# Patient Record
Sex: Male | Born: 1960 | Race: White | Hispanic: No | Marital: Married | Smoking: Former smoker
Health system: Southern US, Community
[De-identification: ages and names within clinical notes are randomized; demographics above are authoritative.]

## PROBLEM LIST (undated history)

## (undated) DIAGNOSIS — I272 Pulmonary hypertension, unspecified: Secondary | ICD-10-CM

## (undated) DIAGNOSIS — E039 Hypothyroidism, unspecified: Secondary | ICD-10-CM

## (undated) DIAGNOSIS — K219 Gastro-esophageal reflux disease without esophagitis: Secondary | ICD-10-CM

## (undated) DIAGNOSIS — J309 Allergic rhinitis, unspecified: Secondary | ICD-10-CM

## (undated) DIAGNOSIS — J45909 Unspecified asthma, uncomplicated: Secondary | ICD-10-CM

## (undated) DIAGNOSIS — J849 Interstitial pulmonary disease, unspecified: Secondary | ICD-10-CM

## (undated) DIAGNOSIS — E669 Obesity, unspecified: Secondary | ICD-10-CM

## (undated) DIAGNOSIS — R0600 Dyspnea, unspecified: Secondary | ICD-10-CM

## (undated) DIAGNOSIS — T8859XA Other complications of anesthesia, initial encounter: Secondary | ICD-10-CM

## (undated) DIAGNOSIS — M48061 Spinal stenosis, lumbar region without neurogenic claudication: Secondary | ICD-10-CM

## (undated) DIAGNOSIS — J449 Chronic obstructive pulmonary disease, unspecified: Secondary | ICD-10-CM

## (undated) DIAGNOSIS — G4730 Sleep apnea, unspecified: Secondary | ICD-10-CM

## (undated) DIAGNOSIS — F32A Depression, unspecified: Secondary | ICD-10-CM

## (undated) DIAGNOSIS — M47817 Spondylosis without myelopathy or radiculopathy, lumbosacral region: Secondary | ICD-10-CM

## (undated) DIAGNOSIS — F329 Major depressive disorder, single episode, unspecified: Secondary | ICD-10-CM

## (undated) DIAGNOSIS — I4891 Unspecified atrial fibrillation: Secondary | ICD-10-CM

## (undated) DIAGNOSIS — I10 Essential (primary) hypertension: Secondary | ICD-10-CM

## (undated) DIAGNOSIS — F419 Anxiety disorder, unspecified: Secondary | ICD-10-CM

## (undated) DIAGNOSIS — J189 Pneumonia, unspecified organism: Secondary | ICD-10-CM

## (undated) DIAGNOSIS — D869 Sarcoidosis, unspecified: Secondary | ICD-10-CM

## (undated) DIAGNOSIS — E781 Pure hyperglyceridemia: Secondary | ICD-10-CM

## (undated) HISTORY — PX: OTHER SURGICAL HISTORY: SHX169

## (undated) HISTORY — PX: CARDIAC ELECTROPHYSIOLOGY STUDY AND ABLATION: SHX1294

---
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
## 2008-02-25 HISTORY — PX: RHINOPLASTY: SUR1284

## 2008-02-25 HISTORY — PX: CARDIAC CATHETERIZATION: SHX172

## 2010-02-24 HISTORY — PX: KNEE ARTHROSCOPY: SHX127

## 2010-08-17 ENCOUNTER — Ambulatory Visit: Payer: Self-pay | Admitting: Internal Medicine

## 2010-10-18 ENCOUNTER — Ambulatory Visit: Payer: Self-pay | Admitting: Cardiology

## 2010-11-12 ENCOUNTER — Ambulatory Visit: Payer: Self-pay | Admitting: Gastroenterology

## 2011-02-20 ENCOUNTER — Ambulatory Visit: Payer: Self-pay | Admitting: Surgery

## 2011-02-26 ENCOUNTER — Ambulatory Visit: Payer: Self-pay | Admitting: Surgery

## 2011-02-27 ENCOUNTER — Inpatient Hospital Stay: Payer: Self-pay | Admitting: Surgery

## 2011-03-05 ENCOUNTER — Emergency Department: Payer: Self-pay | Admitting: Surgery

## 2012-02-25 DIAGNOSIS — D649 Anemia, unspecified: Secondary | ICD-10-CM

## 2012-02-25 HISTORY — PX: KNEE ARTHROSCOPY: SHX127

## 2012-02-25 HISTORY — PX: CARDIAC ELECTROPHYSIOLOGY STUDY AND ABLATION: SHX1294

## 2012-02-25 HISTORY — DX: Anemia, unspecified: D64.9

## 2012-02-25 HISTORY — PX: CARDIAC CATHETERIZATION: SHX172

## 2013-02-24 HISTORY — PX: EXCISIONAL HEMORRHOIDECTOMY: SHX1541

## 2013-04-20 ENCOUNTER — Ambulatory Visit: Payer: Self-pay | Admitting: Family Medicine

## 2013-04-20 ENCOUNTER — Emergency Department: Payer: Self-pay | Admitting: Emergency Medicine

## 2013-04-20 LAB — TROPONIN I: Troponin-I: <0.02 ng/mL

## 2013-04-20 LAB — CBC
HCT: 46.2 % (ref 40.0–52.0)
HGB: 15.1 g/dL (ref 13.0–18.0)
MCH: 29.6 pg (ref 26.0–34.0)
MCHC: 32.6 g/dL (ref 32.0–36.0)
MCV: 91 fL (ref 80–100)
Platelet: 211 10*3/uL (ref 150–440)
RBC: 5.08 10*6/uL (ref 4.40–5.90)
RDW: 13.4 % (ref 11.5–14.5)
WBC: 10.6 10*3/uL (ref 3.8–10.6)

## 2013-04-20 LAB — BASIC METABOLIC PANEL
Anion Gap: 5 — ABNORMAL LOW (ref 7–16)
BUN: 12 mg/dL (ref 7–18)
CO2: 29 mmol/L (ref 21–32)
CREATININE: 1.1 mg/dL (ref 0.60–1.30)
Calcium, Total: 9.3 mg/dL (ref 8.5–10.1)
Chloride: 106 mmol/L (ref 98–107)
EGFR (African American): >60
EGFR (Non-African Amer.): >60
GLUCOSE: 88 mg/dL (ref 65–99)
OSMOLALITY: 279 (ref 275–301)
Potassium: 4 mmol/L (ref 3.5–5.1)
SODIUM: 140 mmol/L (ref 136–145)

## 2013-12-21 DIAGNOSIS — I272 Pulmonary hypertension, unspecified: Secondary | ICD-10-CM | POA: Insufficient documentation

## 2014-01-26 ENCOUNTER — Ambulatory Visit: Payer: Self-pay | Admitting: Cardiology

## 2014-02-24 HISTORY — PX: OTHER SURGICAL HISTORY: SHX169

## 2014-05-11 DIAGNOSIS — E781 Pure hyperglyceridemia: Secondary | ICD-10-CM | POA: Insufficient documentation

## 2014-06-18 NOTE — Consult Note (Signed)
PATIENT NAME:  Robert Green, FOOT MR#:  [id number] DATE OF BIRTH:  April 18, 1960  DATE OF CONSULTATION:  03/05/2011  REFERRING PHYSICIAN:   CONSULTING PHYSICIAN:  Loreli Dollar, MD  HISTORY OF PRESENT ILLNESS: This 54 year old male who had recent hemorrhoid surgery a week ago called with the chief complaint of a temperature of 102. He had had the recent internal and external hemorrhoidectomy under general anesthesia and the day after surgery had a temperature of 102 and we had a suspicion of infection and put him on intravenous Invanz, later sent him home on intravenous Augmentin 875 mg b.i.d. He has also been taking Percocet and Toradol and has had some bleeding from his operative site but was somewhat improved today, passing gas, has been moving his bowels his discharge.   He does, however, report that he also has a sore throat, it hurts to swallow and has noted some soreness in his neck as well.   PAST MEDICAL HISTORY: 1. Atrial fibrillation status post ablation 2008.  2. Sleep apnea and uses CPAP machine. 3. History of hypothyroidism. 4. History of pulmonary hypertension. 5. Reactive airways disease.   MEDICATIONS:  1. Levothyroxine.  2. Celexa.  3. Singulair.  4. Advair.  5. Recent Percocet. 6. Toradol. 7. Augmentin 875 mg b.i.d.   PHYSICAL EXAMINATION:  GENERAL: He is awake, alert, oriented.   VITAL SIGNS: Temperatures 100.2, pulse 89, respirations 22, blood pressure 124/72, pulse oximetry 98%.   SKIN: Warm, dry.   HEENT: Pharynx appears to have some purulent discharge and also has several blisters identified including largest appeared to be about 5 mm on the left side of the soft palate and his neck was with some mild soreness although no distinct adenopathy.   LUNGS: Sounds were clear. No respiratory distress.  RECTAL: The anal area was examined. I could identify his wounds, which are not yet healed, some scant mucus-type discharge. I do not see any significant erythema and  there is no palpable mass at this site.   LABORATORY, DIAGNOSTIC AND RADIOLOGICAL DATA: We did a quick strep test that was negative, also a throat culture.   IMPRESSION: Pharyngitis which at present appears is most likely viral.   PLAN: Continue with Augmentin 875 mg b.i.d. and take the Tylenol and/or Percocet if needed. He has a prescription for nystatin to swish and swallow. He will go ahead and return home and he will plan to follow with me in the office in one week.   ____________________________ J. Rochel Brome, MD jws:cms D: 03/05/2011 19:27:42 ET T: 03/06/2011 05:58:40 ET JOB#: [id number]  cc: Loreli Dollar, MD, <Dictator>  Loreli Dollar MD ELECTRONICALLY SIGNED 03/30/2011 15:04

## 2014-06-18 NOTE — Discharge Summary (Signed)
PATIENT NAME:  Robert Green, Robert Green MR#:  [id number] DATE OF BIRTH:  [birth year]  DATE OF ADMISSION:  02/27/2011 DATE OF DISCHARGE:  03/01/2011  ADMITTING DIAGNOSIS: Postoperative pain.   DISCHARGE DIAGNOSES:  1. Postoperative pain. 2. Postoperative infection.   HISTORY OF PRESENT ILLNESS: This is a 54 year old male who underwent internal and external hemorrhoidectomy for multiple large hemorrhoids on 01/02. He was discharged home the afternoon after surgery and pain was controlled. However, that evening he called the on-call physician with increasing uncontrolled pain. It was recommended that he come to the hospital for admission. He was admitted later that evening and started on a morphine PCA. He was using this very frequently for the first 12 hours. He was able to gradually decrease IV pain medication. He used Toradol a couple of times and then was able to transition to [employer] only. On the evening of postoperative day #1, he had a temperature up to 102. He was started on IV Invanz. After that the temperature did not go above 100. He was kept on Invanz while he was here. He was tolerating a diet, passing gas. Moderate amount of bloody drainage on the dressing. On the day of discharge, he was examined by the covering physician and deemed appropriate for discharge.   DISPOSITION: Discharged home with self-care in good condition.   MEDICATIONS:  1. Levothyroxine 125 mcg daily.  2. Zyrtec as needed.  3. Citalopram 40 mg 1/2 tab in the evening. 4. Ibuprofen 800 mg as needed.  5. Colace 50 mg daily.  6. Percocet 5/325mg   every 4 to 6 hours as needed.  7. Toradol 10 mg every six hours as needed.  8.   Amoxicillin 875mg  BID  DISCHARGE INSTRUCTIONS:  1. Change dressing as needed.  2. May shower.  3. Regular diet.  4. No exertional activity or heavy lifting.  5. Follow-up in two weeks for recheck or sooner with any concerns.    ____________________________ Celene Squibb. [place], [place] amc:ap D: 03/03/2011  11:23:06 ET T: 03/04/2011 13:04:00 ET JOB#: [id number]  cc: Celene Squibb. Theda Sers, [place], <Dictator> ANN M COLLINS PA ELECTRONICALLY SIGNED 03/04/2011 15:03

## 2014-06-18 NOTE — Op Note (Signed)
PATIENT NAME:  Robert Green, Robert Green MR#:  [id number] DATE OF BIRTH:  08-07-1960  DATE OF PROCEDURE:  02/26/2011  PREOPERATIVE DIAGNOSIS: Internal and external hemorrhoids.   POSTOPERATIVE DIAGNOSIS: Internal and external hemorrhoids.   PROCEDURE: Internal and external hemorrhoidectomy.   SURGEON: Loreli Dollar, MD  ANESTHESIA: General.   INDICATIONS: This 54 year old male has a 15 year history of intermittent hemorrhoidal symptoms including bleeding, also had large internal and external hemorrhoids demonstrated on physical exam and surgery was recommended for definitive treatment.   DESCRIPTION OF PROCEDURE: The patient was placed on the operating table in the supine position under general anesthesia. Legs were elevated into the lithotomy position using ankle straps, also pads were put between his lower legs and the upright supports. The anal area was prepared with Betadine solution and draped in a sterile manner.   Initial inspection revealed three large external hemorrhoids and also a smaller external hemorrhoid. Digital exam demonstrated no palpable mass. The anoderm was infiltrated with 0.5% Sensorcaine with epinephrine. The anal canal was dilated large enough to admit four fingers. The bivalve anal retractor was introduced. Multiple large internal hemorrhoids were identified.   The hemorrhoid at the 4:00 position was removed first with the bivalve retractor in place. A high ligation of the internal component was done with a 2-0 chromic suture ligature. Next, a V-shaped incision was made externally to score the skin. Next, the incision was continued with electrocautery and then used the Harmonic scalpel to dissect out the external hemorrhoid. The internal anal sphincter was identified and the dissection was carried with the Harmonic scalpel up to the internal component up to the previously placed suture ligature. Next, the internal component was further ligated with the same 2-0 chromic ligature.  The hemorrhoid was amputated. Several small bleeding points were cauterized. Hemostasis was subsequently intact. Next, the wound was closed with a running locked 2-0 chromic stitch carrying this out onto the external component closing the skin but leaving a small opening for drainage distally.   Next, this identical procedure was carried out at the 8:00 position with a similar suture ligature, external incision and similar dissection preserving the internal sphincter and also ligated and closed in a similar manner. Next, the identical procedure was carried out at the 10:00 position with a similar dissection, excision, and closure. Next, an external hemorrhoid at the 1:00 position was excised with an elliptical excision and dissected out the external hemorrhoid with electrocautery and put in a single 2-0 chromic to close the wound. Next, there was another hemorrhoid clot which was identified at approximately the 7:00 position which was opened and excised and left open for drainage.   Hemostasis subsequently appeared to be intact. The dressings were applied with paper tape. The patient tolerated surgery satisfactorily and was then prepared for transfer to the recovery room.  ____________________________ Lenna Sciara. Rochel Brome, MD jws:cms D: 02/26/2011 10:36:21 ET T: 02/26/2011 12:45:04 ET JOB#: [id number]  cc: Loreli Dollar, MD, <Dictator> Loreli Dollar MD ELECTRONICALLY SIGNED 03/26/2011 18:13

## 2014-08-17 DIAGNOSIS — R931 Abnormal findings on diagnostic imaging of heart and coronary circulation: Secondary | ICD-10-CM | POA: Insufficient documentation

## 2015-02-25 HISTORY — PX: SHOULDER ARTHROSCOPY: SHX128

## 2016-02-08 ENCOUNTER — Encounter: Payer: Self-pay | Admitting: *Deleted

## 2016-02-11 ENCOUNTER — Ambulatory Visit: Payer: BC Managed Care – PPO | Admitting: Anesthesiology

## 2016-02-11 ENCOUNTER — Encounter: Payer: Self-pay | Admitting: *Deleted

## 2016-02-11 ENCOUNTER — Ambulatory Visit
Admission: RE | Admit: 2016-02-11 | Discharge: 2016-02-11 | Disposition: A | Discharge status: Home / Self Care | Payer: BC Managed Care – PPO | Source: Ambulatory Visit | Attending: Gastroenterology | Admitting: Gastroenterology

## 2016-02-11 ENCOUNTER — Encounter
Admission: RE | Disposition: A | Discharge status: Home / Self Care | Payer: Self-pay | Source: Ambulatory Visit | Attending: Gastroenterology

## 2016-02-11 DIAGNOSIS — K640 First degree hemorrhoids: Secondary | ICD-10-CM | POA: Diagnosis not present

## 2016-02-11 DIAGNOSIS — D125 Benign neoplasm of sigmoid colon: Secondary | ICD-10-CM | POA: Diagnosis not present

## 2016-02-11 DIAGNOSIS — Z6841 Body Mass Index (BMI) 40.0 and over, adult: Secondary | ICD-10-CM | POA: Insufficient documentation

## 2016-02-11 DIAGNOSIS — Z86010 Personal history of colonic polyps: Secondary | ICD-10-CM | POA: Insufficient documentation

## 2016-02-11 DIAGNOSIS — E039 Hypothyroidism, unspecified: Secondary | ICD-10-CM | POA: Insufficient documentation

## 2016-02-11 DIAGNOSIS — J45909 Unspecified asthma, uncomplicated: Secondary | ICD-10-CM | POA: Insufficient documentation

## 2016-02-11 DIAGNOSIS — K5730 Diverticulosis of large intestine without perforation or abscess without bleeding: Secondary | ICD-10-CM | POA: Insufficient documentation

## 2016-02-11 DIAGNOSIS — Z79899 Other long term (current) drug therapy: Secondary | ICD-10-CM | POA: Insufficient documentation

## 2016-02-11 DIAGNOSIS — F419 Anxiety disorder, unspecified: Secondary | ICD-10-CM | POA: Diagnosis not present

## 2016-02-11 DIAGNOSIS — I4891 Unspecified atrial fibrillation: Secondary | ICD-10-CM | POA: Diagnosis not present

## 2016-02-11 DIAGNOSIS — E781 Pure hyperglyceridemia: Secondary | ICD-10-CM | POA: Insufficient documentation

## 2016-02-11 DIAGNOSIS — F329 Major depressive disorder, single episode, unspecified: Secondary | ICD-10-CM | POA: Insufficient documentation

## 2016-02-11 DIAGNOSIS — G4730 Sleep apnea, unspecified: Secondary | ICD-10-CM | POA: Insufficient documentation

## 2016-02-11 DIAGNOSIS — R159 Full incontinence of feces: Secondary | ICD-10-CM | POA: Insufficient documentation

## 2016-02-11 HISTORY — DX: Unspecified atrial fibrillation: I48.91

## 2016-02-11 HISTORY — DX: Sleep apnea, unspecified: G47.30

## 2016-02-11 HISTORY — DX: Hypothyroidism, unspecified: E03.9

## 2016-02-11 HISTORY — DX: Obesity, unspecified: E66.9

## 2016-02-11 HISTORY — DX: Pure hyperglyceridemia: E78.1

## 2016-02-11 HISTORY — DX: Unspecified asthma, uncomplicated: J45.909

## 2016-02-11 HISTORY — DX: Depression, unspecified: F32.A

## 2016-02-11 HISTORY — DX: Anxiety disorder, unspecified: F41.9

## 2016-02-11 HISTORY — DX: Major depressive disorder, single episode, unspecified: F32.9

## 2016-02-11 HISTORY — DX: Pulmonary hypertension, unspecified: I27.20

## 2016-02-11 HISTORY — DX: Allergic rhinitis, unspecified: J30.9

## 2016-02-11 HISTORY — PX: COLONOSCOPY WITH PROPOFOL: SHX5780

## 2016-02-11 SURGERY — COLONOSCOPY WITH PROPOFOL
Anesthesia: General

## 2016-02-11 MED ORDER — FENTANYL CITRATE (PF) 100 MCG/2ML IJ SOLN
INTRAMUSCULAR | Status: DC | PRN
  Administered 2016-02-11: 50 ug via INTRAVENOUS

## 2016-02-11 MED ORDER — SODIUM CHLORIDE 0.9 % IV SOLN
INTRAVENOUS | Status: DC
  Administered 2016-02-11 (×2): via INTRAVENOUS

## 2016-02-11 MED ORDER — MIDAZOLAM HCL 2 MG/2ML IJ SOLN
INTRAMUSCULAR | Status: DC | PRN
  Administered 2016-02-11: 2 mg via INTRAVENOUS

## 2016-02-11 MED ORDER — PROPOFOL 10 MG/ML IV BOLUS
INTRAVENOUS | Status: DC | PRN
Start: 2016-02-11 — End: 2016-02-11
  Administered 2016-02-11: 100 mg via INTRAVENOUS

## 2016-02-11 MED ORDER — PROPOFOL 500 MG/50ML IV EMUL
INTRAVENOUS | Status: DC | PRN
  Administered 2016-02-11: 180 ug/kg/min via INTRAVENOUS

## 2016-02-11 NOTE — Transfer of Care (Signed)
Immediate Anesthesia Transfer of Care Note  Patient: Robert Green  Procedure(s) Performed: Procedure(s): COLONOSCOPY WITH PROPOFOL (N/A)  Patient Location: PACU  Anesthesia Type:General  Level of Consciousness: awake  Airway & Oxygen Therapy: Patient Spontanous Breathing and Patient connected to nasal cannula oxygen  Post-op Assessment: Report given to RN and Post -op Vital signs reviewed and stable  Post vital signs: Reviewed and stable  Last Vitals:  Vitals:   02/11/16 1250  BP: 134/88  Pulse: 88  Resp: 20  Temp: 36.2 C    Last Pain:  Vitals:   02/11/16 1250  TempSrc: Tympanic         Complications: No apparent anesthesia complications

## 2016-02-11 NOTE — Anesthesia Postprocedure Evaluation (Signed)
Anesthesia Post Note  Patient: Robert Green  Procedure(s) Performed: Procedure(s) (LRB): COLONOSCOPY WITH PROPOFOL (N/A)  Patient location during evaluation: Endoscopy Anesthesia Type: General Level of consciousness: awake and alert and oriented Pain management: pain level controlled Vital Signs Assessment: post-procedure vital signs reviewed and stable Respiratory status: spontaneous breathing, nonlabored ventilation and respiratory function stable Cardiovascular status: blood pressure returned to baseline and stable Postop Assessment: no signs of nausea or vomiting Anesthetic complications: no     Last Vitals:  Vitals:   02/11/16 1541 02/11/16 1551  BP: 111/80 114/71  Pulse: 76 84  Resp: 18 17  Temp:      Last Pain:  Vitals:   02/11/16 1521  TempSrc: Tympanic                 Etheline Geppert

## 2016-02-11 NOTE — Op Note (Signed)
[facility] Gastroenterology Patient Name: Jabulani Flexer Procedure Date: 02/11/2016 2:14 PM MRN: [id number] Account #: [id number] Date of Birth: 1960-09-12 Admit Type: Outpatient Age: 55 Room: [facility] ENDO ROOM 1 Gender: Male Note Status: Finalized Procedure:            Colonoscopy Indications:          Personal history of colonic polyps Providers:            Lollie Sails, MD Referring MD:         Juluis Rainier (Referring MD) Medicines:            Monitored Anesthesia Care Complications:        No immediate complications. Procedure:            Pre-Anesthesia Assessment:                       - ASA Grade Assessment: III - A patient with severe                        systemic disease.                       After obtaining informed consent, the colonoscope was                        passed under direct vision. Throughout the procedure,                        the patient's blood pressure, pulse, and oxygen                        saturations were monitored continuously. The Olympus                        PCF-H180AL colonoscope ( S#: [id number] ) was introduced                        through the anus and advanced to the the cecum,                        identified by appendiceal orifice and ileocecal valve.                        The colonoscopy was performed without difficulty. The                        patient tolerated the procedure well. The quality of                        the bowel preparation was fair. Findings:      A few small-mouthed diverticula were found in the sigmoid colon and       descending colon.      A 3 mm polyp was found in the distal sigmoid colon. The polyp was flat.       The polyp was removed with a cold biopsy forceps. Resection and       retrieval were complete.      The entire examined colon appeared normal.      Non-bleeding internal hemorrhoids were found during anoscopy. The       hemorrhoids were small and Grade  I (internal  hemorrhoids that do not       prolapse). Impression:           - Preparation of the colon was fair.                       - Diverticulosis in the sigmoid colon and in the                        descending colon.                       - One 3 mm polyp in the distal sigmoid colon, removed                        with a cold biopsy forceps. Resected and retrieved.                       - The entire examined colon is normal. Recommendation:       - Await pathology results.                       - Telephone GI clinic for pathology results in 1 week. Procedure Code(s):    --- Professional ---                       [phone], Colonoscopy, flexible; with biopsy, single or                        multiple Diagnosis Code(s):    --- Professional ---                       D12.5, Benign neoplasm of sigmoid colon                       Z86.010, Personal history of colonic polyps                       K57.30, Diverticulosis of large intestine without                        perforation or abscess without bleeding CPT copyright 2016 American [employer]. All rights reserved. The codes documented in this report are preliminary and upon coder review may  be revised to meet current compliance requirements. Lollie Sails, MD 02/11/2016 3:12:51 PM This report has been signed electronically. Number of Addenda: 0 Note Initiated On: 02/11/2016 2:14 PM Scope Withdrawal Time: 0 hours 15 minutes 40 seconds  Total Procedure Duration: 0 hours 33 minutes 4 seconds       [facility]

## 2016-02-11 NOTE — Anesthesia Preprocedure Evaluation (Signed)
Anesthesia Evaluation  Patient identified by MRN, date of birth, ID band  Reviewed: Allergy & Precautions, NPO status , Patient's Chart, lab work & pertinent test results  Airway Mallampati: II       Dental  (+) Teeth Intact   Pulmonary sleep apnea , COPD, former smoker,     + decreased breath sounds      Cardiovascular Exercise Tolerance: Good + dysrhythmias Atrial Fibrillation  Rhythm:Irregular     Neuro/Psych Anxiety Depression    GI/Hepatic negative GI ROS, Neg liver ROS,   Endo/Other  Hypothyroidism Morbid obesity  Renal/GU negative Renal ROS     Musculoskeletal   Abdominal (+) + obese,   Peds  Hematology negative hematology ROS (+)   Anesthesia Other Findings   Reproductive/Obstetrics                             Anesthesia Physical Anesthesia Plan  ASA: III  Anesthesia Plan: General   Post-op Pain Management:    Induction: Intravenous  Airway Management Planned: Natural Airway and Nasal Cannula  Additional Equipment:   Intra-op Plan:   Post-operative Plan:   Informed Consent: I have reviewed the patients History and Physical, chart, labs and discussed the procedure including the risks, benefits and alternatives for the proposed anesthesia with the patient or authorized representative who has indicated his/her understanding and acceptance.     Plan Discussed with: CRNA  Anesthesia Plan Comments:         Anesthesia Quick Evaluation

## 2016-02-11 NOTE — Anesthesia Procedure Notes (Signed)
Date/Time: 02/11/2016 2:50 PM Performed by: Allean Found Pre-anesthesia Checklist: Patient identified, Emergency Drugs available, Suction available, Patient being monitored and Timeout performed Oxygen Delivery Method: Nasal cannula Placement Confirmation: positive ETCO2

## 2016-02-11 NOTE — H&P (Signed)
Outpatient short stay form Pre-procedure 02/11/2016 2:22 PM Lollie Sails MD  Primary Physician: Mercy Riding, NP  Reason for visit:  Colonoscopy  History of present illness:  Patient is a 55 year old male presenting today as well. He has personal history of adenomatous colon polyps with his last colonoscopy being 11/12/2010. That time he had a hemorrhoidectomy multiple complications. His last procedure in that regard was about 2 years ago. He does have a small amount of fecal leakage however is doing well currently otherwise. He does have atrial fibrillation but does not take any blood thinning agents. He takes no aspirin product. He tolerated his prep well.    Current Facility-Administered Medications:  .  0.9 %  sodium chloride infusion, , Intravenous, Continuous, Lollie Sails, MD, Last Rate: 20 mL/hr at 02/11/16 1304  Prescriptions Prior to Admission  Medication Sig Dispense Refill Last Dose  . albuterol (PROVENTIL HFA;VENTOLIN HFA) 108 (90 Base) MCG/ACT inhaler Inhale 2 puffs into the lungs every 6 (six) hours as needed for wheezing or shortness of breath.   Past Week at Unknown time  . cetirizine (ZYRTEC) 10 MG tablet Take 10 mg by mouth daily.   Past Week at Unknown time  . diclofenac (VOLTAREN) 50 MG EC tablet Take 50 mg by mouth 2 (two) times daily.   Past Month at Unknown time  . Fluticasone-Salmeterol (ADVAIR) 100-50 MCG/DOSE AEPB Inhale 1 puff into the lungs 2 (two) times daily.   Past Month at Unknown time  . furosemide (LASIX) 20 MG tablet Take 20 mg by mouth.   Past Month at Unknown time  . levothyroxine (SYNTHROID, LEVOTHROID) 125 MCG tablet Take 125 mcg by mouth daily before breakfast.   02/11/2016 at 0600  . LORazepam (ATIVAN) 2 MG tablet Take 2 mg by mouth every 6 (six) hours as needed for anxiety.   Past Week at Unknown time  . montelukast (SINGULAIR) 10 MG tablet Take 10 mg by mouth at bedtime.   02/10/2016 at Unknown time  . pantoprazole (PROTONIX) 40 MG tablet  Take 40 mg by mouth daily.   Past Month at Unknown time  . pimecrolimus (ELIDEL) 1 % cream Apply topically 2 (two) times daily.   Past Month at Unknown time     Allergies  Allergen Reactions  . Adhesive [Tape] Dermatitis  . Latex Dermatitis  . Citalopram Palpitations     Past Medical History:  Diagnosis Date  . Anxiety   . Atrial fibrillation ([place])   . Depression   . Hypertriglyceridemia   . Hypothyroidism   . Obesity   . Pulmonary hypertension   . RAD (reactive airway disease)   . Rhinitis, allergic   . Sleep apnea     Review of systems:      Physical Exam    Heart and lungs: Regular rate and rhythm without rub or gallop, lungs are bilaterally clear.    HEENT: Normocephalic atraumatic eyes are anicteric    Other:    Pertinant exam for procedure: Soft nontender nondistended bowel sounds positive normoactive.    Planned proceedures: Colonoscopy and indicated procedures. I have discussed the risks benefits and complications of procedures to include not limited to bleeding, infection, perforation and the risk of sedation and the patient wishes to proceed. Outpatient short stay form Pre-procedure 02/11/2016 2:25 PM Lollie Sails MD

## 2016-02-12 ENCOUNTER — Encounter: Payer: Self-pay | Admitting: Gastroenterology

## 2016-02-13 LAB — SURGICAL PATHOLOGY

## 2016-02-25 HISTORY — PX: CARDIAC ELECTROPHYSIOLOGY STUDY AND ABLATION: SHX1294

## 2016-09-09 DIAGNOSIS — Z01818 Encounter for other preprocedural examination: Secondary | ICD-10-CM | POA: Insufficient documentation

## 2016-09-25 DIAGNOSIS — M7520 Bicipital tendinitis, unspecified shoulder: Secondary | ICD-10-CM | POA: Insufficient documentation

## 2017-04-20 ENCOUNTER — Other Ambulatory Visit: Payer: Self-pay | Admitting: Nurse Practitioner

## 2017-04-20 ENCOUNTER — Ambulatory Visit
Admission: RE | Admit: 2017-04-20 | Discharge: 2017-04-20 | Disposition: A | Discharge status: Home / Self Care | Payer: BC Managed Care – PPO | Source: Ambulatory Visit | Attending: Nurse Practitioner | Admitting: Nurse Practitioner

## 2017-04-20 DIAGNOSIS — R600 Localized edema: Secondary | ICD-10-CM | POA: Insufficient documentation

## 2017-04-20 DIAGNOSIS — M79661 Pain in right lower leg: Secondary | ICD-10-CM | POA: Insufficient documentation

## 2017-04-20 DIAGNOSIS — R0602 Shortness of breath: Secondary | ICD-10-CM

## 2017-04-21 ENCOUNTER — Other Ambulatory Visit: Payer: Self-pay | Admitting: Nurse Practitioner

## 2017-04-21 DIAGNOSIS — M79661 Pain in right lower leg: Secondary | ICD-10-CM

## 2017-04-21 DIAGNOSIS — R0602 Shortness of breath: Secondary | ICD-10-CM

## 2017-04-21 DIAGNOSIS — R600 Localized edema: Secondary | ICD-10-CM

## 2017-05-07 ENCOUNTER — Encounter (INDEPENDENT_AMBULATORY_CARE_PROVIDER_SITE_OTHER): Payer: Self-pay | Admitting: Vascular Surgery

## 2017-05-07 ENCOUNTER — Ambulatory Visit (INDEPENDENT_AMBULATORY_CARE_PROVIDER_SITE_OTHER): Payer: BC Managed Care – PPO | Admitting: Vascular Surgery

## 2017-05-07 DIAGNOSIS — J449 Chronic obstructive pulmonary disease, unspecified: Secondary | ICD-10-CM

## 2017-05-07 DIAGNOSIS — M79605 Pain in left leg: Secondary | ICD-10-CM

## 2017-05-07 DIAGNOSIS — I83813 Varicose veins of bilateral lower extremities with pain: Secondary | ICD-10-CM | POA: Diagnosis not present

## 2017-05-07 DIAGNOSIS — I872 Venous insufficiency (chronic) (peripheral): Secondary | ICD-10-CM | POA: Diagnosis not present

## 2017-05-07 DIAGNOSIS — K219 Gastro-esophageal reflux disease without esophagitis: Secondary | ICD-10-CM

## 2017-05-07 DIAGNOSIS — M79604 Pain in right leg: Secondary | ICD-10-CM | POA: Diagnosis not present

## 2017-05-07 DIAGNOSIS — G4762 Sleep related leg cramps: Secondary | ICD-10-CM

## 2017-05-08 ENCOUNTER — Encounter (INDEPENDENT_AMBULATORY_CARE_PROVIDER_SITE_OTHER): Payer: Self-pay | Admitting: Vascular Surgery

## 2017-05-08 DIAGNOSIS — K219 Gastro-esophageal reflux disease without esophagitis: Secondary | ICD-10-CM | POA: Insufficient documentation

## 2017-05-08 DIAGNOSIS — M79606 Pain in leg, unspecified: Secondary | ICD-10-CM | POA: Insufficient documentation

## 2017-05-08 DIAGNOSIS — G4762 Sleep related leg cramps: Secondary | ICD-10-CM | POA: Insufficient documentation

## 2017-05-08 DIAGNOSIS — J449 Chronic obstructive pulmonary disease, unspecified: Secondary | ICD-10-CM | POA: Insufficient documentation

## 2017-05-08 DIAGNOSIS — I872 Venous insufficiency (chronic) (peripheral): Secondary | ICD-10-CM | POA: Insufficient documentation

## 2017-05-08 DIAGNOSIS — I83813 Varicose veins of bilateral lower extremities with pain: Secondary | ICD-10-CM | POA: Insufficient documentation

## 2017-05-08 NOTE — Progress Notes (Signed)
MRN : [id number]  Robert Green is a 57 y.o. ([birth year]) male who presents with chief complaint of  Chief Complaint  Patient presents with  . New Patient (Initial Visit)    ref Gauger for bil le edema  .  History of Present Illness:  The patient is seen for evaluation of symptomatic varicose veins. The patient relates burning and stinging which worsened steadily throughout the course of the day, particularly with standing. The patient also notes an aching and throbbing pain over the varicosities, particularly with prolonged dependent positions. The symptoms are significantly improved with elevation.  The patient also notes that during hot weather the symptoms are greatly intensified. The patient states the pain from the varicose veins interferes with work, daily exercise, shopping and household maintenance. At this point, the symptoms are persistent and severe enough that they're having a negative impact on lifestyle and are interfering with daily activities.  There is no history of DVT, PE or superficial thrombophlebitis. There is no history of ulceration or hemorrhage. The patient denies a significant family history of varicose veins.  The patient has not worn graduated compression in the past. At the present time the patient has not been using over-the-counter analgesics. There is a history of prior ablation of both legs with  Sclerotherapy.  This was years ago.  The patient is also c/o of painful lower extremities. The pain occurs primarily at night while the patient is in bed.   The patient describes it as a cramping or Charley horse type pain. The patient notes the pain isn't associated with activity and is not very consistent day to day. The pain seems to be variable with time. Typically the pain occurs with varying positions and seems to progress until the leg is stretched. The pain has been progressive over the past several years which has prompted the concern for evaluation. The  patient states this inability to walk has a significant negative impact on her quality of life and daily activities.  The patient denies a history of degenerative spine disease.  The patient denies rest pain. The patient denies dangling off the affected extremity during the night for pain relief. There are no open wounds or sores at this time.  No prior arterial vascular interventions or vascular surgeries.  The patient denies amaurosis fugax or recent TIA symptoms. There are no recent neurological changes noted. The patient denies history of DVT, PE or superficial thrombophlebitis. The patient denies recent episodes of angina or shortness of breath.      Current Meds  Medication Sig  . albuterol (PROVENTIL HFA;VENTOLIN HFA) 108 (90 Base) MCG/ACT inhaler Inhale 2 puffs into the lungs every 6 (six) hours as needed for wheezing or shortness of breath.  . cetirizine (ZYRTEC) 10 MG tablet Take 10 mg by mouth daily.  . diclofenac (VOLTAREN) 50 MG EC tablet Take 50 mg by mouth daily.   . Fluticasone-Salmeterol (ADVAIR) 100-50 MCG/DOSE AEPB Inhale 1 puff into the lungs daily.   . furosemide (LASIX) 20 MG tablet Take 20 mg by mouth as needed.   . hydrocortisone (PROCTOSOL HC) 2.5 % rectal cream Proctosol HC 2.5 % topical cream perineal applicator  INSERT INTO THE RECTUM TWO (2) TIMES A DAY AS DIRECTED  . levothyroxine (SYNTHROID, LEVOTHROID) 125 MCG tablet Take 125 mcg by mouth daily before breakfast.  . LORazepam (ATIVAN) 2 MG tablet Take 2 mg by mouth every 6 (six) hours as needed for anxiety.  . montelukast (SINGULAIR) 10 MG tablet  Take 10 mg by mouth at bedtime.    Past Medical History:  Diagnosis Date  . Anxiety   . Atrial fibrillation ([place])   . Depression   . Hypertriglyceridemia   . Hypothyroidism   . Obesity   . Pulmonary hypertension ([place])   . RAD (reactive airway disease)   . Rhinitis, allergic   . Sleep apnea     Past Surgical History:  Procedure Laterality Date  .  arthroscopic knee Bilateral   . CARDIAC ELECTROPHYSIOLOGY STUDY AND ABLATION    . COLONOSCOPY WITH PROPOFOL N/A 02/11/2016   Procedure: COLONOSCOPY WITH PROPOFOL;  Surgeon: Lollie Sails, MD;  Location: [facility] ENDOSCOPY;  Service: Endoscopy;  Laterality: N/A;  . fistulla repair      Social History Social History   Tobacco Use  . Smoking status: Former Smoker    Last attempt to quit: 02/25/1988    Years since quitting: 29.2  . Smokeless tobacco: Former Systems developer    Types: Chew    Quit date: 02/10/2006  Substance Use Topics  . Alcohol use: Yes    Alcohol/week: 0.6 oz    Types: 1 Cans of beer per week  . Drug use: No    Family History Family History  Problem Relation Age of Onset  . Cancer Father   . Diabetes Sister   . Diabetes Brother   No family history of bleeding/clotting disorders, porphyria or autoimmune disease   Allergies  Allergen Reactions  . Adhesive [Tape] Dermatitis  . Latex Dermatitis  . Tetracyclines & Related   . Citalopram Palpitations  . Povidone Iodine Rash     REVIEW OF SYSTEMS (Negative unless checked)  Constitutional: [] Weight loss  [] Fever  [] Chills Cardiac: [] Chest pain   [] Chest pressure   [] Palpitations   [] Shortness of breath when laying flat   [] Shortness of breath with exertion. Vascular:  [] Pain in legs with walking   [x] Pain in legs at rest  [] History of DVT   [] Phlebitis   [x] Swelling in legs   [x] Varicose veins   [] Non-healing ulcers Pulmonary:   [] Uses home oxygen   [] Productive cough   [] Hemoptysis   [] Wheeze  [] COPD   [] Asthma Neurologic:  [] Dizziness   [] Seizures   [] History of stroke   [] History of TIA  [] Aphasia   [] Vissual changes   [] Weakness or numbness in arm   [] Weakness or numbness in leg Musculoskeletal:   [] Joint swelling   [] Joint pain   [] Low back pain Hematologic:  [] Easy bruising  [] Easy bleeding   [] Hypercoagulable state   [] Anemic Gastrointestinal:  [] Diarrhea   [] Vomiting  [] Gastroesophageal reflux/heartburn    [] Difficulty swallowing. Genitourinary:  [] Chronic kidney disease   [] Difficult urination  [] Frequent urination   [] Blood in urine Skin:  [] Rashes   [] Ulcers  Psychological:  [x] History of anxiety   []  History of major depression.  Physical Examination  Vitals:   05/07/17 1449  BP: 132/74  Pulse: 60  Resp: 17  Weight: (!) 375 lb (170.1 kg)  Height: 6\' 4"  (1.93 m)   Body mass index is 45.65 kg/m. Gen: WD/WN, NAD Head: [place]/AT, No temporalis wasting.  Ear/Nose/Throat: Hearing grossly intact, nares w/o erythema or drainage, poor dentition Eyes: PER, EOMI, sclera nonicteric.  Neck: Supple, no masses.  No bruit or JVD.  Pulmonary:  Good air movement, clear to auscultation bilaterally, no use of accessory muscles.  Cardiac: RRR, normal S1, S2, no Murmurs. Vascular: Large varicosities present extensively greater than 10 mm bilaterally.  Mild venous stasis changes to the  legs bilaterally.  2+ soft pitting edema Vessel Right Left  Radial Palpable Palpable  PT Palpable Palpable  DP Palpable Palpable  Gastrointestinal: soft, non-distended. No guarding/no peritoneal signs.  Musculoskeletal: M/S 5/5 throughout.  No deformity or atrophy.  Neurologic: CN 2-12 intact. Pain and light touch intact in extremities.  Symmetrical.  Speech is fluent. Motor exam as listed above. Psychiatric: Judgment intact, Mood & affect appropriate for pt's clinical situation. Dermatologic: moderate venous rashes no ulcers noted.  No changes consistent with cellulitis. Lymph : No Cervical lymphadenopathy, no lichenification or skin changes of chronic lymphedema.  CBC Lab Results  Component Value Date   WBC 10.6 04/20/2013   HGB 15.1 04/20/2013   HCT 46.2 04/20/2013   MCV 91 04/20/2013   PLT 211 04/20/2013    BMET    Component Value Date/Time   NA 140 04/20/2013 1855   K 4.0 04/20/2013 1855   CL 106 04/20/2013 1855   CO2 29 04/20/2013 1855   GLUCOSE 88 04/20/2013 1855   BUN 12 04/20/2013 1855    CREATININE 1.10 04/20/2013 1855   CALCIUM 9.3 04/20/2013 1855   GFRNONAA >60 04/20/2013 1855   GFRAA >60 04/20/2013 1855   CrCl cannot be calculated (Patient's most recent lab result is older than the maximum 21 days allowed.).  COAG No results found for: INR, PROTIME  Radiology US Venous Img Lower Unilateral Right  Result Date: 04/20/2017 CLINICAL DATA:  Right lower extremity pain and edema for the past 3 weeks. Evaluate for DVT. EXAM: RIGHT LOWER EXTREMITY VENOUS DOPPLER ULTRASOUND TECHNIQUE: Gray-scale sonography with graded compression, as well as color Doppler and duplex ultrasound were performed to evaluate the lower extremity deep venous systems from the level of the common femoral vein and including the common femoral, femoral, profunda femoral, popliteal and calf veins including the posterior tibial, peroneal and gastrocnemius veins when visible. The superficial great saphenous vein was also interrogated. Spectral Doppler was utilized to evaluate flow at rest and with distal augmentation maneuvers in the common femoral, femoral and popliteal veins. COMPARISON:  None. FINDINGS: Contralateral Common Femoral Vein: Respiratory phasicity is normal and symmetric with the symptomatic side. No evidence of thrombus. Normal compressibility. Common Femoral Vein: No evidence of thrombus. Normal compressibility, respiratory phasicity and response to augmentation. Saphenofemoral Junction: No evidence of thrombus. Normal compressibility and flow on color Doppler imaging. Profunda Femoral Vein: No evidence of thrombus. Normal compressibility and flow on color Doppler imaging. Femoral Vein: No evidence of thrombus. Normal compressibility, respiratory phasicity and response to augmentation. Popliteal Vein: No evidence of thrombus. Normal compressibility, respiratory phasicity and response to augmentation. Calf Veins: No evidence of thrombus. Normal compressibility and flow on color Doppler imaging. Superficial  Great Saphenous Vein: No evidence of thrombus. Normal compressibility. Venous Reflux:  None. Other Findings:  None. IMPRESSION: No evidence of DVT within the right lower extremity. Electronically Signed   By: Sandi Mariscal M.D.   On: 04/20/2017 16:56     Assessment/Plan 1. Varicose veins of both lower extremities with pain  Recommend:  The patient has large symptomatic varicose veins that are painful and associated with swelling.  I have had a long discussion with the patient regarding  varicose veins and why they cause symptoms.  Patient will begin wearing graduated compression stockings class 1 on a daily basis, beginning first thing in the morning and removing them in the evening. The patient is instructed specifically not to sleep in the stockings.    The patient  will also begin  using over-the-counter analgesics such as Motrin 600 mg po TID to help control the symptoms.    In addition, behavioral modification including elevation during the day will be initiated.    Pending the results of these changes the  patient will be reevaluated in three months.   An  ultrasound of the venous system will be obtained.   Further plans will be based on the ultrasound results and whether conservative therapies are successful at eliminating the pain and swelling.   - VAS [place] LOWER EXTREMITY VENOUS REFLUX; Future  2. Chronic venous insufficiency No surgery or intervention at this point in time.    I have had a long discussion with the patient regarding venous insufficiency and why it  causes symptoms. I have discussed with the patient the chronic skin changes that accompany venous insufficiency and the long term sequela such as infection and ulceration.  Patient will begin wearing graduated compression stockings class 1 (20-30 mmHg) or compression wraps on a daily basis a prescription was given. The patient will put the stockings on first thing in the morning and removing them in the evening. The patient is  instructed specifically not to sleep in the stockings.    In addition, behavioral modification including several periods of elevation of the lower extremities during the day will be continued. I have demonstrated that proper elevation is a position with the ankles at heart level.  The patient is instructed to begin routine exercise, especially walking on a daily basis  Patient should undergo duplex ultrasound of the venous system to ensure that DVT or reflux is not present.  3. Pain in both lower extremities See #1&2  4. Leg cramps, sleep related Recommend:  The patient is describing Charley horse type leg cramps. No invasive studies, angiography or surgery at this time.    I have reviewed homeopathic remedies such as Cider vinegar or mustard; placing a bar of soap at the bottom of the bed. Quinine is also an option Magnesium supplementation at bedtime was also reviewed.  The patient should continue walking and begin a more formal exercise program.  The patient should continue antiplatelet therapy and aggressive treatment of the lipid abnormalities  The patient should continue wearing graduated compression socks 20-30 mmHg strength to control any mild edema.  The patient will follow up with me on a PRN basis.   5. Chronic obstructive pulmonary disease, unspecified COPD type ([place]) Continue pulmonary medications and aerosols as already ordered, these medications have been reviewed and there are no changes at this time.    6. Gastroesophageal reflux disease, esophagitis presence not specified Continue antihypertensive medications as already ordered, these medications have been reviewed and there are no changes at this time.  Avoidence of caffeine and alcohol  Moderate elevation of the head of the bed     Hortencia Pilar, MD  05/08/2017 12:43 PM

## 2017-06-04 ENCOUNTER — Ambulatory Visit (INDEPENDENT_AMBULATORY_CARE_PROVIDER_SITE_OTHER): Payer: BC Managed Care – PPO | Admitting: Vascular Surgery

## 2017-06-04 ENCOUNTER — Ambulatory Visit (INDEPENDENT_AMBULATORY_CARE_PROVIDER_SITE_OTHER): Payer: BC Managed Care – PPO

## 2017-06-04 ENCOUNTER — Encounter (INDEPENDENT_AMBULATORY_CARE_PROVIDER_SITE_OTHER): Payer: Self-pay | Admitting: Vascular Surgery

## 2017-06-04 VITALS — BP 139/77 | HR 62 | Resp 17 | Ht 76.0 in | Wt 376.8 lb

## 2017-06-04 DIAGNOSIS — I83813 Varicose veins of bilateral lower extremities with pain: Secondary | ICD-10-CM | POA: Diagnosis not present

## 2017-06-04 DIAGNOSIS — J449 Chronic obstructive pulmonary disease, unspecified: Secondary | ICD-10-CM | POA: Diagnosis not present

## 2017-06-04 DIAGNOSIS — K219 Gastro-esophageal reflux disease without esophagitis: Secondary | ICD-10-CM

## 2017-06-04 DIAGNOSIS — I872 Venous insufficiency (chronic) (peripheral): Secondary | ICD-10-CM

## 2017-06-07 ENCOUNTER — Encounter (INDEPENDENT_AMBULATORY_CARE_PROVIDER_SITE_OTHER): Payer: Self-pay | Admitting: Vascular Surgery

## 2017-06-07 NOTE — Progress Notes (Addendum)
MRN : [id number]  Robert Green is a 57 y.o. (1960/06/20) male who presents with chief complaint of  Chief Complaint  Patient presents with  . Follow-up  .  History of Present Illness: The patient returns for followup evaluation 3 months after the initial visit. The patient continues to have pain in the lower extremities with dependency. The pain is lessened with elevation. Graduated compression stockings, Class I (20-30 mmHg), have been worn but the stockings do not eliminate the leg pain. Over-the-counter analgesics do not improve the symptoms. The degree of discomfort continues to interfere with daily activities. The patient notes the pain in the legs is causing problems with daily exercise, at the workplace and even with household activities and maintenance such as standing in the kitchen preparing meals and doing dishes.   Venous ultrasound shows normal deep venous system, no evidence of acute or chronic DVT but deep reflux is present.  Superficial reflux is present in the GSV  Current Meds  Medication Sig  . albuterol (PROVENTIL HFA;VENTOLIN HFA) 108 (90 Base) MCG/ACT inhaler Inhale 2 puffs into the lungs every 6 (six) hours as needed for wheezing or shortness of breath.  . cetirizine (ZYRTEC) 10 MG tablet Take 10 mg by mouth daily.  . Fluticasone-Salmeterol (ADVAIR) 100-50 MCG/DOSE AEPB Inhale 1 puff into the lungs daily.   . furosemide (LASIX) 20 MG tablet Take 20 mg by mouth as needed.   . hydrocortisone (PROCTOSOL HC) 2.5 % rectal cream Proctosol HC 2.5 % topical cream perineal applicator  INSERT INTO THE RECTUM TWO (2) TIMES A DAY AS DIRECTED  . ibuprofen (ADVIL,MOTRIN) 200 MG tablet Take 200 mg by mouth every 6 (six) hours as needed.  Mar[place] Kitchen levothyroxine (SYNTHROID, LEVOTHROID) 125 MCG tablet Take 125 mcg by mouth daily before breakfast.  . LORazepam (ATIVAN) 2 MG tablet Take 2 mg by mouth every 6 (six) hours as needed for anxiety.  . montelukast (SINGULAIR) 10 MG tablet Take 10  mg by mouth at bedtime.  . pantoprazole (PROTONIX) 40 MG tablet Take 40 mg by mouth daily.  . pimecrolimus (ELIDEL) 1 % cream Apply topically 2 (two) times daily.    Past Medical History:  Diagnosis Date  . Anxiety   . Atrial fibrillation ([place])   . Depression   . Hypertriglyceridemia   . Hypothyroidism   . Obesity   . Pulmonary hypertension ([place])   . RAD (reactive airway disease)   . Rhinitis, allergic   . Sleep apnea     Past Surgical History:  Procedure Laterality Date  . arthroscopic knee Bilateral   . CARDIAC ELECTROPHYSIOLOGY STUDY AND ABLATION    . COLONOSCOPY WITH PROPOFOL N/A 02/11/2016   Procedure: COLONOSCOPY WITH PROPOFOL;  Surgeon: Lollie Sails, MD;  Location: [facility] ENDOSCOPY;  Service: Endoscopy;  Laterality: N/A;  . fistulla repair      Social History Social History   Tobacco Use  . Smoking status: Former Smoker    Last attempt to quit: 02/25/1988    Years since quitting: 29.3  . Smokeless tobacco: Former Systems developer    Types: Chew    Quit date: 02/10/2006  Substance Use Topics  . Alcohol use: Yes    Alcohol/week: 0.6 oz    Types: 1 Cans of beer per week  . Drug use: No    Family History Family History  Problem Relation Age of Onset  . Cancer Father   . Diabetes Sister   . Diabetes Brother     Allergies  Allergen Reactions  . Adhesive [Tape] Dermatitis  . Latex Dermatitis  . Tetracyclines & Related   . Citalopram Palpitations  . Povidone Iodine Rash     REVIEW OF SYSTEMS (Negative unless checked)  Constitutional: [] Weight loss  [] Fever  [] Chills Cardiac: [] Chest pain   [] Chest pressure   [] Palpitations   [] Shortness of breath when laying flat   [] Shortness of breath with exertion. Vascular:  [] Pain in legs with walking   [x] Pain in legs at rest  [] History of DVT   [] Phlebitis   [x] Swelling in legs   [x] Varicose veins   [] Non-healing ulcers Pulmonary:   [] Uses home oxygen   [] Productive cough   [] Hemoptysis   [] Wheeze  [] COPD    [] Asthma Neurologic:  [] Dizziness   [] Seizures   [] History of stroke   [] History of TIA  [] Aphasia   [] Vissual changes   [] Weakness or numbness in arm   [] Weakness or numbness in leg Musculoskeletal:   [] Joint swelling   [] Joint pain   [] Low back pain Hematologic:  [] Easy bruising  [] Easy bleeding   [] Hypercoagulable state   [] Anemic Gastrointestinal:  [] Diarrhea   [] Vomiting  [] Gastroesophageal reflux/heartburn   [] Difficulty swallowing. Genitourinary:  [] Chronic kidney disease   [] Difficult urination  [] Frequent urination   [] Blood in urine Skin:  [] Rashes   [] Ulcers  Psychological:  [] History of anxiety   []  History of major depression.  Physical Examination  Vitals:   06/04/17 1619  BP: 139/77  Pulse: 62  Resp: 17  Weight: (!) 376 lb 12.8 oz (170.9 kg)  Height: 6\' 4"  (1.93 m)   Body mass index is 45.87 kg/m. Gen: WD/WN, NAD Head: [place]/AT, No temporalis wasting.  Ear/Nose/Throat: Hearing grossly intact, nares w/o erythema or drainage Eyes: PER, EOMI, sclera nonicteric.  Neck: Supple, no large masses.   Pulmonary:  Good air movement, no audible wheezing bilaterally, no use of accessory muscles.  Cardiac: RRR, no JVD Vascular: scattered varicosities present bilaterally.  Mild venous stasis changes to the legs bilaterally.  2+ soft pitting edema Vessel Right Left  Radial Palpable Palpable  PT Palpable Palpable  DP Palpable Palpable  Gastrointestinal: Non-distended. No guarding/no peritoneal signs.  Musculoskeletal: M/S 5/5 throughout.  No deformity or atrophy.  Neurologic: CN 2-12 intact. Symmetrical.  Speech is fluent. Motor exam as listed above. Psychiatric: Judgment intact, Mood & affect appropriate for pt's clinical situation. Dermatologic: venous rashes no ulcers noted.  No changes consistent with cellulitis. Lymph : No lichenification or skin changes of chronic lymphedema.  CBC Lab Results  Component Value Date   WBC 10.6 04/20/2013   HGB 15.1 04/20/2013   HCT 46.2  04/20/2013   MCV 91 04/20/2013   PLT 211 04/20/2013    BMET    Component Value Date/Time   NA 140 04/20/2013 1855   K 4.0 04/20/2013 1855   CL 106 04/20/2013 1855   CO2 29 04/20/2013 1855   GLUCOSE 88 04/20/2013 1855   BUN 12 04/20/2013 1855   CREATININE 1.10 04/20/2013 1855   CALCIUM 9.3 04/20/2013 1855   GFRNONAA >60 04/20/2013 1855   GFRAA >60 04/20/2013 1855   CrCl cannot be calculated (Patient's most recent lab result is older than the maximum 21 days allowed.).  COAG No results found for: INR, PROTIME  Radiology No results found.    Assessment/Plan 1. Varicose veins of both lower extremities with pain Recommend:  The patient is complaining of varicose veins.    I have had a long discussion with the patient regarding  varicose veins and why they cause symptoms.  Patient will begin wearing graduated compression stockings on a daily basis, beginning first thing in the morning and removing them in the evening. The patient is instructed specifically not to sleep in the stockings.    The patient  will also begin using over-the-counter analgesics such as Motrin 600 mg po TID to help control the symptoms as needed.    In addition, behavioral modification including elevation during the day will be initiated, utilizing a recliner was recommended.  The patient is also instructed to continue exercising such as walking 4-5 times per week.  At this time the patient wishes to continue conservative therapy and is not interested in more invasive treatments such as laser ablation and sclerotherapy.   2. Chronic venous insufficiency No surgery or intervention at this point in time.    I have had a long discussion with the patient regarding venous insufficiency and why it  causes symptoms. I have discussed with the patient the chronic skin changes that accompany venous insufficiency and the long term sequela such as infection and ulceration.  Patient will begin wearing graduated  compression stockings class 1 (20-30 mmHg) or compression wraps on a daily basis a prescription was given. The patient will put the stockings on first thing in the morning and removing them in the evening. The patient is instructed specifically not to sleep in the stockings.    In addition, behavioral modification including several periods of elevation of the lower extremities during the day will be continued. I have demonstrated that proper elevation is a position with the ankles at heart level.  The patient is instructed to begin routine exercise, especially walking on a daily basis  Following the review of the ultrasound the patient will follow up in 6 months to reassess the degree of swelling and the control that graduated compression stockings or compression wraps  is offering.   The patient can be assessed for a Lymph Pump at that time  3. Chronic obstructive pulmonary disease, unspecified COPD type ([place]) Continue pulmonary medications and aerosols as already ordered, these medications have been reviewed and there are no changes at this time.    4. Gastroesophageal reflux disease, esophagitis presence not specified Continue antihypertensive medications as already ordered, these medications have been reviewed and there are no changes at this time.  Avoidence of caffeine and alcohol  Moderate elevation of the head of the bed   5.  Lymphedema Patient has lymphedema Patient would benefit from a lymphedema pump  Hortencia Pilar, MD  06/07/2017 3:24 PM

## 2017-06-08 ENCOUNTER — Telehealth (INDEPENDENT_AMBULATORY_CARE_PROVIDER_SITE_OTHER): Payer: Self-pay

## 2017-06-08 NOTE — Telephone Encounter (Signed)
Sherry from [employer] called asking if we could go back and add a diagnosis of Lymphedema to the patient's las office visit note, so that they can go ahead with the order for his lymphedema pump.  She states that the diagnosis that's on the patient's chart as of now, doesn't qualify him for the Lymph pump.  Is this something that we can do? If so, once you get it done I will fax the new office visit note over to [facility] at [facility] [phone] Sherry's direct line is [phone] ext. [place]

## 2017-06-09 ENCOUNTER — Other Ambulatory Visit (INDEPENDENT_AMBULATORY_CARE_PROVIDER_SITE_OTHER): Payer: Self-pay | Admitting: Vascular Surgery

## 2017-06-09 DIAGNOSIS — I890 Lymphedema, not elsewhere classified: Secondary | ICD-10-CM | POA: Insufficient documentation

## 2017-06-09 NOTE — Telephone Encounter (Signed)
I faxed over the newly updated office visit note to [facility]. Should she have any questions or concerns, she will be contacting me by phone.

## 2017-06-09 NOTE — Telephone Encounter (Signed)
Ok added it.

## 2017-08-10 ENCOUNTER — Ambulatory Visit (INDEPENDENT_AMBULATORY_CARE_PROVIDER_SITE_OTHER): Payer: BC Managed Care – PPO | Admitting: Vascular Surgery

## 2017-08-10 ENCOUNTER — Encounter (INDEPENDENT_AMBULATORY_CARE_PROVIDER_SITE_OTHER): Payer: BC Managed Care – PPO

## 2017-12-04 ENCOUNTER — Encounter (INDEPENDENT_AMBULATORY_CARE_PROVIDER_SITE_OTHER): Payer: Self-pay

## 2017-12-07 ENCOUNTER — Ambulatory Visit (INDEPENDENT_AMBULATORY_CARE_PROVIDER_SITE_OTHER): Payer: BC Managed Care – PPO | Admitting: Vascular Surgery

## 2018-02-08 DIAGNOSIS — M5417 Radiculopathy, lumbosacral region: Secondary | ICD-10-CM | POA: Insufficient documentation

## 2018-02-08 DIAGNOSIS — M48062 Spinal stenosis, lumbar region with neurogenic claudication: Secondary | ICD-10-CM | POA: Insufficient documentation

## 2018-03-05 DIAGNOSIS — G629 Polyneuropathy, unspecified: Secondary | ICD-10-CM | POA: Insufficient documentation

## 2018-03-05 DIAGNOSIS — M79604 Pain in right leg: Secondary | ICD-10-CM | POA: Insufficient documentation

## 2018-03-05 DIAGNOSIS — R200 Anesthesia of skin: Secondary | ICD-10-CM | POA: Insufficient documentation

## 2018-03-05 DIAGNOSIS — M5416 Radiculopathy, lumbar region: Secondary | ICD-10-CM | POA: Insufficient documentation

## 2018-03-09 DIAGNOSIS — M47817 Spondylosis without myelopathy or radiculopathy, lumbosacral region: Secondary | ICD-10-CM | POA: Insufficient documentation

## 2018-10-17 IMAGING — US US EXTREM LOW VENOUS*R*
1 series · 13 of 24 positions shown · non-contrast
Comparison: None.

CLINICAL DATA: Right lower extremity pain and edema for the past 3
weeks. Evaluate for DVT.



[Series 1: us extrem low venous*right* · 0.11mm/px · 13 of 34 slices shown]
[im 1/34]
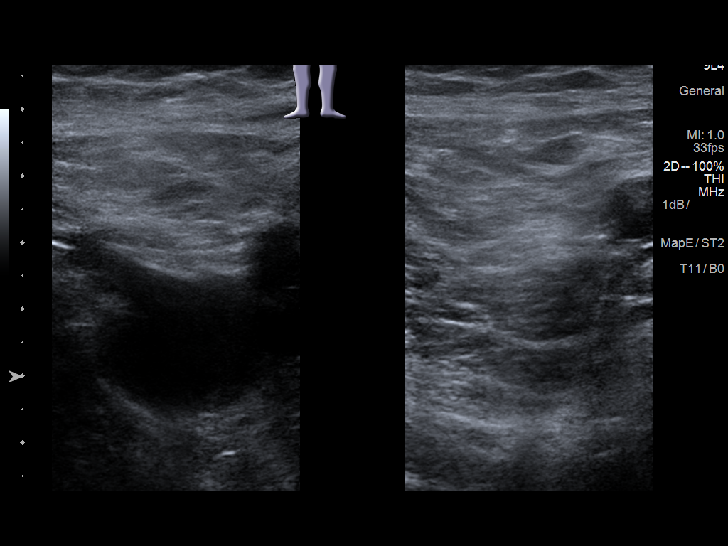
[im 3/34]
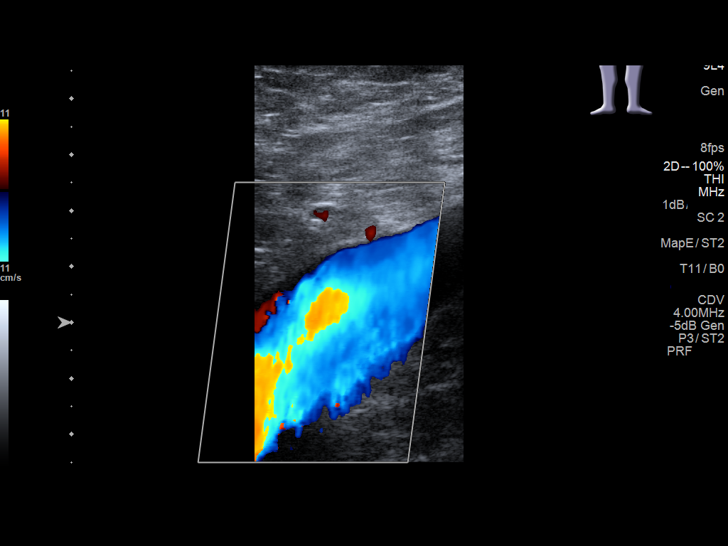
[im 6/34]
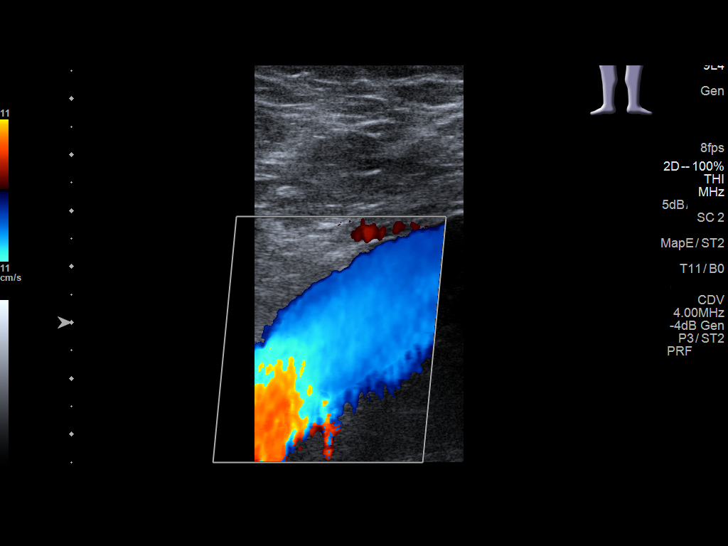
[im 9/34]
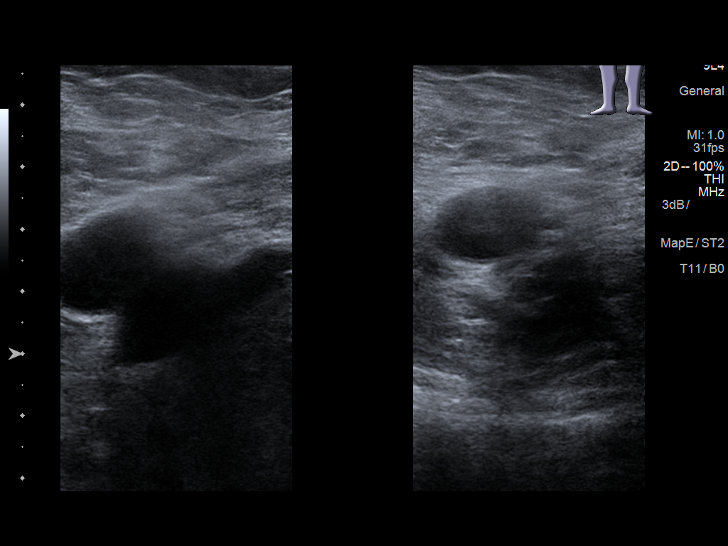
[im 12/34]
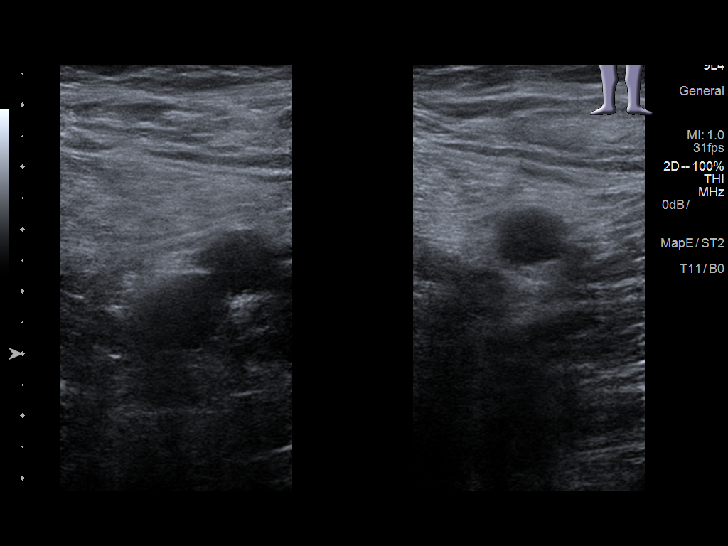
[im 15/34]
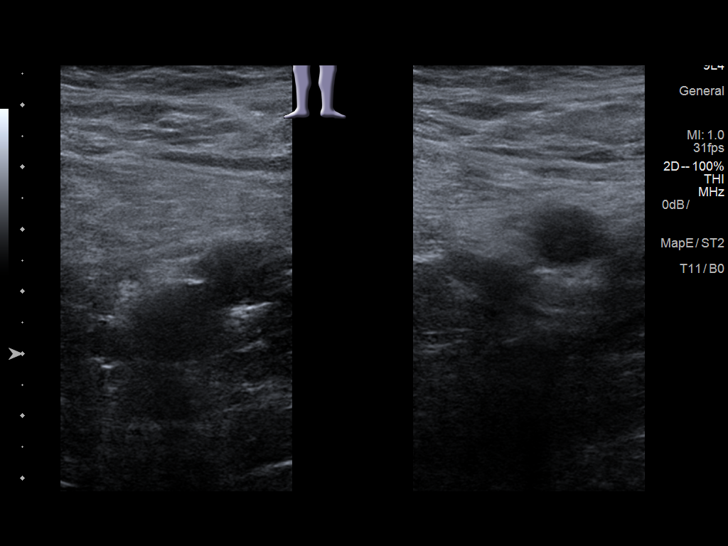
[im 18/34]
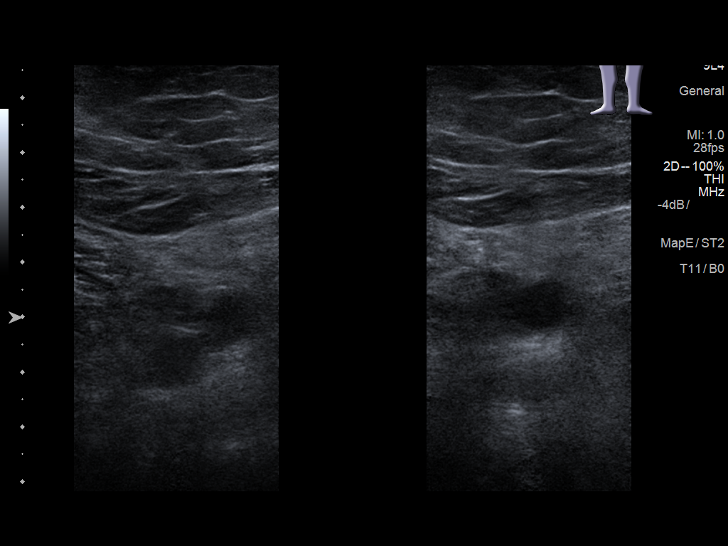
[im 19/34]
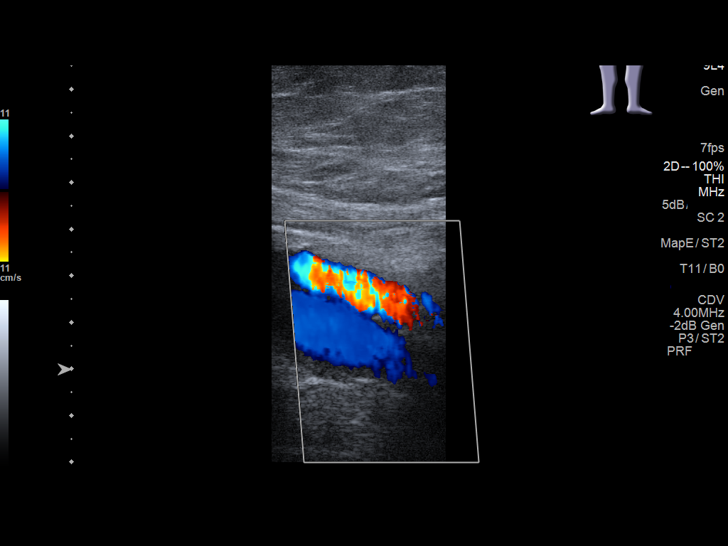
[im 22/34]
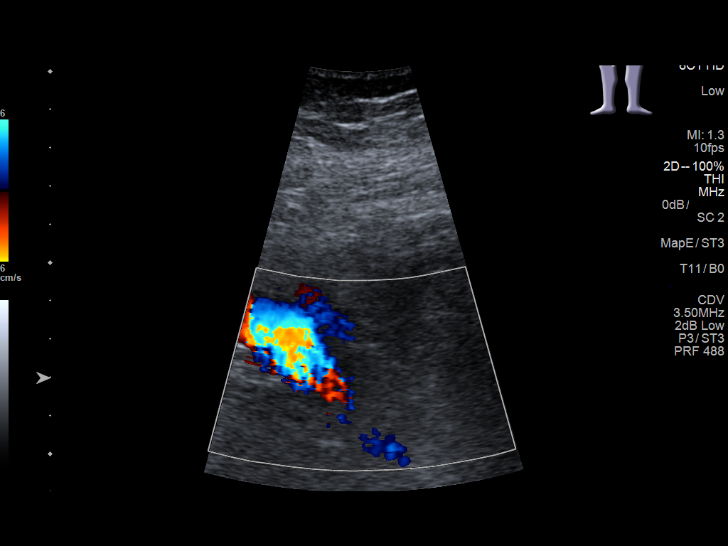
[im 25/34]
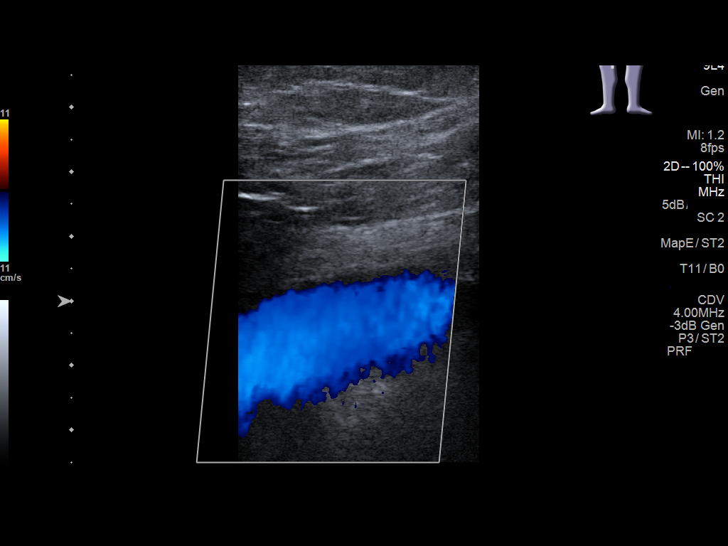
[im 28/34]
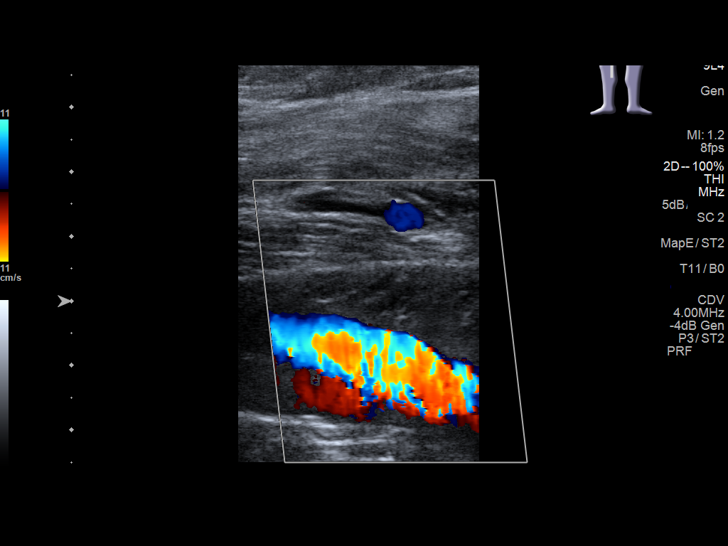
[im 31/34]
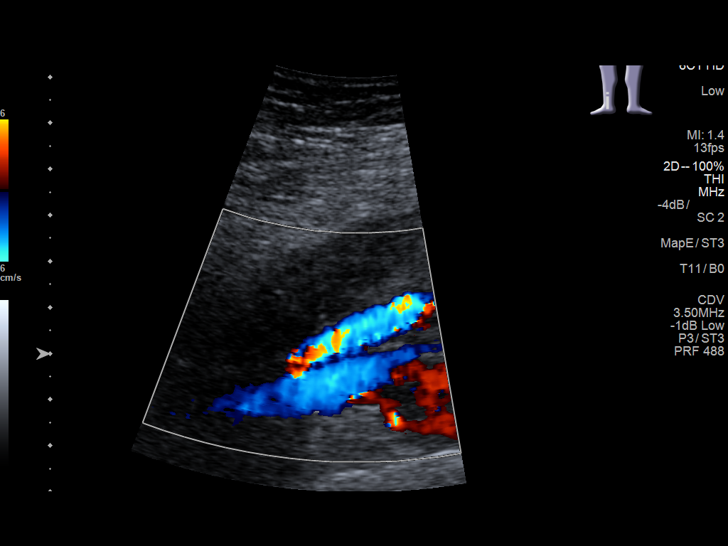
[im 34/34]
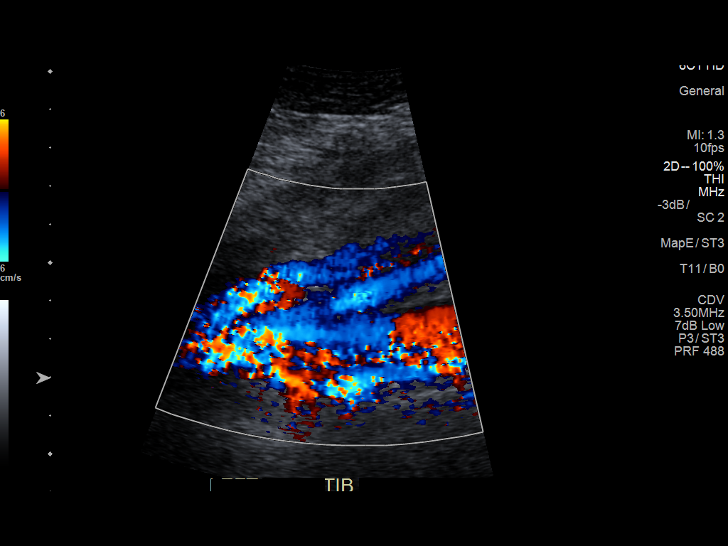

[13 of 24 positions shown; findings below may reference images not displayed]

FINDINGS: Contralateral Common Femoral Vein: Respiratory phasicity is normal
and symmetric with the symptomatic side. No evidence of thrombus.
Normal compressibility.

Common Femoral Vein: No evidence of thrombus. Normal
compressibility, respiratory phasicity and response to augmentation.

Saphenofemoral Junction: No evidence of thrombus. Normal
compressibility and flow on color Doppler imaging.

Profunda Femoral Vein: No evidence of thrombus. Normal
compressibility and flow on color Doppler imaging.

Femoral Vein: No evidence of thrombus. Normal compressibility,
respiratory phasicity and response to augmentation.

Popliteal Vein: No evidence of thrombus. Normal compressibility,
respiratory phasicity and response to augmentation.

Calf Veins: No evidence of thrombus. Normal compressibility and flow
on color Doppler imaging.

Superficial Great Saphenous Vein: No evidence of thrombus. Normal
compressibility.

Venous Reflux:  None.

Other Findings:  None.
IMPRESSION: No evidence of DVT within the right lower extremity.

## 2019-06-22 DIAGNOSIS — H903 Sensorineural hearing loss, bilateral: Secondary | ICD-10-CM | POA: Insufficient documentation

## 2021-02-12 DIAGNOSIS — I482 Chronic atrial fibrillation, unspecified: Secondary | ICD-10-CM | POA: Insufficient documentation

## 2021-02-12 DIAGNOSIS — G4733 Obstructive sleep apnea (adult) (pediatric): Secondary | ICD-10-CM | POA: Insufficient documentation

## 2022-01-14 ENCOUNTER — Other Ambulatory Visit: Payer: Self-pay

## 2022-01-14 ENCOUNTER — Ambulatory Visit
Admission: RE | Admit: 2022-01-14 | Discharge: 2022-01-14 | Disposition: A | Discharge status: Home / Self Care | Payer: Self-pay | Source: Ambulatory Visit | Attending: Neurosurgery | Admitting: Neurosurgery

## 2022-01-14 DIAGNOSIS — Z049 Encounter for examination and observation for unspecified reason: Secondary | ICD-10-CM

## 2022-01-30 ENCOUNTER — Encounter: Payer: Self-pay | Admitting: Neurosurgery

## 2022-01-30 ENCOUNTER — Ambulatory Visit: Payer: BC Managed Care – PPO | Admitting: Neurosurgery

## 2022-01-30 DIAGNOSIS — M5441 Lumbago with sciatica, right side: Secondary | ICD-10-CM | POA: Diagnosis not present

## 2022-01-30 DIAGNOSIS — M4726 Other spondylosis with radiculopathy, lumbar region: Secondary | ICD-10-CM | POA: Diagnosis not present

## 2022-01-30 DIAGNOSIS — M5416 Radiculopathy, lumbar region: Secondary | ICD-10-CM

## 2022-01-30 DIAGNOSIS — M47819 Spondylosis without myelopathy or radiculopathy, site unspecified: Secondary | ICD-10-CM

## 2022-01-30 NOTE — Progress Notes (Signed)
Referring Physician:  Sallee Lange, NP [address] [place],  [place] [address]  Primary Physician:  Sallee Lange, NP  History of Present Illness: 01/30/2022 [name] is a 61 y.o with a history of peripheral neuropathy,OA, hypothyroidism, GERD, and chronic lumbosacral complaints who is here today with a chief complaint of acute worsening of chronic lumbosacral complaints. He states this started about 6 weeks ago as he is currently renovating a house. He describes worsening low back pain that initially radiated across his low back and down his right thigh.He admits to increased left foot pain and numbness in his right heel over the last 6 weeks. In addition to these symptoms he states he has had more urinary urgency but denies any incontinence or loss of sensation. It has since improved some but is still significant and effecting his mobility and quality of life. He has had similar symptoms in the past and underwent multiple injections. He ultimately got relief from RFAs and expresses interest in attempting these again.  Of note, he admits to daily use of chewing tobacco  LOV 01/29/2018 with Dr. Izora Ribas Mr. [name] is here today with a chief complaint of back and leg discomfort. He first had a back injury while playing football in the late 1970s. He is then had a work injury in the mid 1990s while twisting holding a crate. He fell down to the ground with back pain and took 5 minutes to get up. He had severe pain, saw surgeon, but was not recommended to have surgery. Since that time, he has had periodic back and leg discomfort. He had his first injections and nonoperative work-up in 2008. He had ongoing shots every couple of years until this year, when he has had 3 injections. He has not gotten relief of his pain. When his pain is bad, he has back pain as well as pain extending into his legs. Pain goes to his anterior thighs and sometimes to his right ankle. He does get  some numbness when he stands up. His wife notices that he takes a significant amount of time to stand straight. Once he is active, his pain usually gets a bit better. Worst is sitting in 1 position for a long period of time. He also reports that he has had some trips and that he has some trouble lifting up his right ankle when he is having severe pain. He also reports some subjective weakness in his legs. He denies bowel or bladder dysfunction.  Conservative measures:  Physical therapy: none recently   Multimodal medical therapy including regular antiinflammatories: Mobic, Diclofenac and Flexeril with mild relief.  Injections: no recent epidural steroid injections  Past Surgery: no previous lumbar surgeries.  Debby Freiberg Beery has no symptoms of cervical myelopathy.  The symptoms are causing a significant impact on the patient's life.   Review of Systems:  A 10 point review of systems is negative, except for the pertinent positives and negatives detailed in the HPI.  Past Medical History: Past Medical History:  Diagnosis Date   Anxiety    Atrial fibrillation ([place])    Depression    Hypertriglyceridemia    Hypothyroidism    Obesity    Pulmonary hypertension ([place])    RAD (reactive airway disease)    Rhinitis, allergic    Sleep apnea     Past Surgical History: Past Surgical History:  Procedure Laterality Date   arthroscopic knee Bilateral    CARDIAC ELECTROPHYSIOLOGY STUDY AND ABLATION  COLONOSCOPY WITH PROPOFOL N/A 02/11/2016   Procedure: COLONOSCOPY WITH PROPOFOL;  Surgeon: Lollie Sails, MD;  Location: [facility] ENDOSCOPY;  Service: Endoscopy;  Laterality: N/A;   fistulla repair      Allergies: Allergies as of 01/30/2022 - Review Complete 06/07/2017  Allergen Reaction Noted   Adhesive [tape] Dermatitis 02/08/2016   Latex Dermatitis 02/08/2016   Tetracyclines & related     Citalopram Palpitations 02/08/2016   Povidone iodine Rash     Medications: Outpatient Encounter  Medications as of 01/30/2022  Medication Sig   albuterol (PROVENTIL HFA;VENTOLIN HFA) 108 (90 Base) MCG/ACT inhaler Inhale 2 puffs into the lungs every 6 (six) hours as needed for wheezing or shortness of breath.   cetirizine (ZYRTEC) 10 MG tablet Take 10 mg by mouth daily.   diclofenac (VOLTAREN) 50 MG EC tablet Take 50 mg by mouth daily.    Fluticasone-Salmeterol (ADVAIR) 100-50 MCG/DOSE AEPB Inhale 1 puff into the lungs daily.    furosemide (LASIX) 20 MG tablet Take 20 mg by mouth as needed.    hydrocortisone (PROCTOSOL HC) 2.5 % rectal cream Proctosol HC 2.5 % topical cream perineal applicator  INSERT INTO THE RECTUM TWO (2) TIMES A DAY AS DIRECTED   ibuprofen (ADVIL,MOTRIN) 200 MG tablet Take 200 mg by mouth every 6 (six) hours as needed.   levothyroxine (SYNTHROID, LEVOTHROID) 125 MCG tablet Take 125 mcg by mouth daily before breakfast.   LORazepam (ATIVAN) 2 MG tablet Take 2 mg by mouth every 6 (six) hours as needed for anxiety.   montelukast (SINGULAIR) 10 MG tablet Take 10 mg by mouth at bedtime.   pantoprazole (PROTONIX) 40 MG tablet Take 40 mg by mouth daily.   pimecrolimus (ELIDEL) 1 % cream Apply topically 2 (two) times daily.   No facility-administered encounter medications on file as of 01/30/2022.    Social History: Social History   Tobacco Use   Smoking status: Former   Smokeless tobacco: Former    Types: Chew    Quit date: 02/10/2006  Substance Use Topics   Alcohol use: Yes    Alcohol/week: 1.0 standard drink of alcohol    Types: 1 Cans of beer per week   Drug use: No    Family Medical History: Family History  Problem Relation Age of Onset   Cancer Father    Diabetes Sister    Diabetes Brother     Physical Examination: Vitals:   01/30/22 0936  BP: (!) 144/84     General: Patient is well developed, well nourished, calm, collected, and in no apparent distress. Attention to examination is appropriate.  Psychiatric: Patient is  non-anxious.  Head:  Pupils equal, round, and reactive to light.  ENT:  Oral mucosa appears well hydrated.  Neck:   Supple.    Respiratory: Patient is breathing without any difficulty.  Extremities: No edema.  Vascular: Palpable dorsal pedal pulses.  Skin:   On exposed skin, there are no abnormal skin lesions.  NEUROLOGICAL:     Awake, alert, oriented to person, place, and time.  Speech is clear and fluent. Fund of knowledge is appropriate.   Cranial Nerves: Pupils equal round and reactive to light.  Facial tone is symmetric.  Facial sensation is symmetric.  ROM of spine: limited lumbar flexion and extension  Palpation of spine: TTP throughout mid and lower lumbar spine   Strength: Side Biceps Triceps Deltoid Interossei Grip Wrist Ext. Wrist Flex.  R '5 5 5 5 5 5 5  '$ L '5 5 5 '$ 5  $'5 5 5   'z$ Side Iliopsoas Quads Hamstring PF DF EHL  R '5 5 5 5 5 5  '$ L '5 5 5 5 5 5   '$ Reflexes are 1+ and symmetric at the biceps, triceps, brachioradialis, patella and achilles.   Hoffman's is absent.  Clonus is not present.  Toes are down-going.  Decreased sensation to light touch in BLE in stocking pattern   Ambulates with an antalgic gait Unable to tandem gait.   No evidence of dysmetria noted.  Medical Decision Making  Imaging: No recent imaging to review  Assessment and Plan: [name] is a pleasant 61 y.o. male with a long standing history of lumbosacral complaints presenting today with an acute flair of pain over the last 6 weeks. While his low back pain is most severe he does describe symptoms concerning for a potential radiculopathy. While he expresses interest in pursuing injections, I would like to obtain an updated MRI for further evaluation as his last MRI was in 2019 and his symptoms have since changed. In additional to an MRI I have referred him to PT to begin strength training and optimize his conservative management. I will contact him via telephone visit after completion of his MRI and  further plan of care. He expressed understanding and was in agreement with this plan.   Thank you for involving me in the care of this patient.   I spent a total of 45 minutes in both face-to-face and non-face-to-face activities for this visit on the date of this encounter. Including review of outside records, discussion of symptoms, review of previous outdated imaging, physical exam, discussion of plan of care, documentation and order placement.    [facility] Render Dept. of Neurosurgery

## 2022-02-13 ENCOUNTER — Ambulatory Visit
Admission: RE | Admit: 2022-02-13 | Discharge: 2022-02-13 | Disposition: A | Discharge status: Home / Self Care | Payer: Self-pay | Source: Ambulatory Visit | Attending: Neurosurgery | Admitting: Neurosurgery

## 2022-02-13 ENCOUNTER — Other Ambulatory Visit: Payer: Self-pay

## 2022-02-13 DIAGNOSIS — Z049 Encounter for examination and observation for unspecified reason: Secondary | ICD-10-CM

## 2022-02-20 ENCOUNTER — Ambulatory Visit (INDEPENDENT_AMBULATORY_CARE_PROVIDER_SITE_OTHER): Payer: BC Managed Care – PPO | Admitting: Neurosurgery

## 2022-02-20 DIAGNOSIS — M48062 Spinal stenosis, lumbar region with neurogenic claudication: Secondary | ICD-10-CM

## 2022-02-20 NOTE — Progress Notes (Signed)
I spoke with [name] regarding his MRI results today (see below).  He has been working with physical therapy which has helped with core strengthening however he feels that his back pain is worse after the sessions.  He continues to have low back pain that radiates into his right anterior thigh as well as increased numbness particularly in his left lateral foot which is different from his baseline neuropathy.  His symptoms are still affecting his quality life and his ability to do his normal activities.  He would like to move forward with injections.  I will place a referral to Dr. Holley Raring for this.  I will see him back via telephone visit in about 4 to 6 weeks to discuss his progress and further plan of care.  Should his symptoms persist despite completion of physical therapy and other conservative management, it may be reasonable for him to see Dr. Cari Caraway to discuss possible surgical options.  He was encouraged to call the office in the interim should he have any questions or concerns.  He expressed understanding and was in agreement with this plan.  Cooper Render PA-C Neurosurgery

## 2022-03-04 DIAGNOSIS — J309 Allergic rhinitis, unspecified: Secondary | ICD-10-CM | POA: Insufficient documentation

## 2022-03-04 DIAGNOSIS — F418 Other specified anxiety disorders: Secondary | ICD-10-CM | POA: Insufficient documentation

## 2022-03-04 DIAGNOSIS — E039 Hypothyroidism, unspecified: Secondary | ICD-10-CM | POA: Insufficient documentation

## 2022-03-04 DIAGNOSIS — E66813 Obesity, class 3: Secondary | ICD-10-CM | POA: Insufficient documentation

## 2022-03-27 ENCOUNTER — Ambulatory Visit (INDEPENDENT_AMBULATORY_CARE_PROVIDER_SITE_OTHER): Payer: BC Managed Care – PPO | Admitting: Neurosurgery

## 2022-03-27 DIAGNOSIS — M48062 Spinal stenosis, lumbar region with neurogenic claudication: Secondary | ICD-10-CM

## 2022-03-27 DIAGNOSIS — M5416 Radiculopathy, lumbar region: Secondary | ICD-10-CM | POA: Diagnosis not present

## 2022-03-27 NOTE — Progress Notes (Signed)
Neurosurgery Telephone (Audio-Only) Note  Requesting Provider     Gauger, Victoriano Lain, NP [address] [place],  [place] [address] T: [phone] F: [phone]  Primary Care Provider Gauger, Victoriano Lain, NP [place] [address] T: [phone] F: [phone]  Telehealth visit was conducted with Robert Green, a 62 y.o. male via telephone. He understands the limitations of a telephone visit   History of Present Illness: Robert Green is a 62 y.o presenting today via telephone visit for follow up after injections and PT. Unfortunately he was not contacted by Dr. Elwyn Lade office and ultimately saw Debby Freiberg at [facility]. He underwent MBB and bilateral SI injections on 03/04/22. This did help some with his chronic back pain but did not improve his radiating leg pain. He would like to move forward with discussion of surgery.   General Review of Systems:  A ROS was performed including pertinent positive and negatives as documented.  All other systems are negative.   Prior to Admission medications   Medication Sig Start Date End Date Taking? Authorizing Provider  albuterol (PROVENTIL HFA;VENTOLIN HFA) 108 (90 Base) MCG/ACT inhaler Inhale 2 puffs into the lungs every 6 (six) hours as needed for wheezing or shortness of breath.    [provider]  cyclobenzaprine (FLEXERIL) 10 MG tablet Take 10 mg by mouth 3 (three) times daily as needed. 01/10/22   [provider]  diclofenac (VOLTAREN) 50 MG EC tablet Take 50 mg by mouth daily.     [provider]  Fluocinolone Acetonide 0.01 % OIL 5 drops into affected ear(s) QD for 10 days. 10/12/19   [provider]  Fluticasone-Salmeterol (ADVAIR) 100-50 MCG/DOSE AEPB Inhale 1 puff into the lungs daily.     [provider]  furosemide (LASIX) 20 MG tablet Take 20 mg by mouth as needed.     [provider]  hydrocortisone (PROCTOSOL HC) 2.5 % rectal cream Proctosol HC 2.5 % topical  cream perineal applicator  INSERT INTO THE RECTUM TWO (2) TIMES A DAY AS DIRECTED 02/09/17   [provider]  ibuprofen (ADVIL,MOTRIN) 200 MG tablet Take 200 mg by mouth every 6 (six) hours as needed.    [provider]  LORazepam (ATIVAN) 2 MG tablet Take 2 mg by mouth every 6 (six) hours as needed for anxiety.    [provider]  meloxicam (MOBIC) 15 MG tablet Take 15 mg by mouth daily.    [provider]  metroNIDAZOLE (METROGEL) 0.75 % gel Apply 1 Application topically 2 (two) times daily. 04/17/19   [provider]  montelukast (SINGULAIR) 10 MG tablet Take 10 mg by mouth at bedtime.    [provider]  mupirocin ointment (BACTROBAN) 2 % Apply 1 Application topically 2 (two) times daily.    [provider]  nystatin (MYCOSTATIN/NYSTOP) powder Apply 1 Application topically 2 (two) times daily. 04/03/19   [provider]  pantoprazole (PROTONIX) 40 MG tablet Take 40 mg by mouth daily.    [provider]  Semaglutide (RYBELSUS) 3 MG TABS Take 3 mg by mouth daily.    [provider]  SYNTHROID 137 MCG tablet Take 137 mcg by mouth daily.    [provider]  traMADol (ULTRAM) 50 MG tablet Take 50 mg by mouth every 6 (six) hours as needed. 01/10/22   [provider]    DATA REVIEWED    Imaging Studies  MRI 02/13/22  Shows multiple level degenerative changes throughout with  severe stenosis at L2-3  IMPRESSION  Robert Green is a 62 y.o. male who I performed a telephone encounter today for evaluation and management of lumbar stenosis with neurogenic claudication PLAN  Robert Green with lumbar stenosis.  Unfortunately was not able to get in with Dr. Elwyn Lade office and ended up going to [facility] for injections.  While he does not have epidural steroid injections, he would like to come in and discuss surgical options.  I will have him scheduled Dr. Cari Caraway to do so.  He  was encouraged to call the office in the interim with any questions or concerns.  He expressed understanding was in agreement with this plan.  No orders of the defined types were placed in this encounter.   DISPOSITION  Follow up: In person appointment in  Next available with Dr. Rodena Piety, PA   TELEPHONE DOCUMENTATION   This visit was performed via telephone.  Patient location: home Provider location: office  I spent a total of 5 minutes non-face-to-face activities for this visit on the date of this encounter including review of current clinical condition and response to treatment.  The patient is aware of and accepts the limits of this telehealth visit.

## 2022-04-14 NOTE — Progress Notes (Unsigned)
Referring Physician:  Loleta Dicker, [place] [address] [place],  [place] [address]  Primary Physician:  Sallee Lange, NP  History of Present Illness: 04/14/2022 [name] is here today with a chief complaint of ***  Low back pain that radiates into the bilateral legs?  Duration: *** Location: *** Quality: ***burning, prickling, aching,  Severity: *** 10/10 Precipitating: aggravated by ***bending, climbing stairs, lifting, standing, sneezing, and walking Modifying factors: made better by ***medication Weakness: none Timing: ***constant Bowel/Bladder Dysfunction: none  Conservative measures:  Physical therapy: *** has participated in at [facility] from 02/11/22 to 03/28/22. Has he been discharged?  Multimodal medical therapy including regular antiinflammatories: *** flexeril, diclofenac, ibuprofen, meloxicam, tramadol, gabapentin  Injections: *** has received epidural steroid injections 03/11/22: Bilateral SI joint injection (Dr. Fletcher Anon)  Past Surgery: ***denies  Robert Green has ***no symptoms of cervical myelopathy.  The symptoms are causing a significant impact on the patient's life.   I have utilized the care everywhere function in epic to review the outside records available from external health systems.  Progress Note from Robert Green, [place] on 01/30/22: History of Present Illness: 01/30/2022 [name] is a 62 y.o with a history of peripheral neuropathy,OA, hypothyroidism, GERD, and chronic lumbosacral complaints who is here today with a chief complaint of acute worsening of chronic lumbosacral complaints. He states this started about 6 weeks ago as he is currently renovating a house. He describes worsening low back pain that initially radiated across his low back and down his right thigh.He admits to increased left foot pain and numbness in his right heel over the last 6 weeks. In addition to these symptoms he states he has had more  urinary urgency but denies any incontinence or loss of sensation. It has since improved some but is still significant and effecting his mobility and quality of life. He has had similar symptoms in the past and underwent multiple injections. He ultimately got relief from RFAs and expresses interest in attempting these again.  Of note, he admits to daily use of chewing tobacco   LOV 01/29/2018 with Dr. Izora Ribas [name] is here today with a chief complaint of back and leg discomfort. He first had a back injury while playing football in the late 1970s. He is then had a work injury in the mid 1990s while twisting holding a crate. He fell down to the ground with back pain and took 5 minutes to get up. He had severe pain, saw surgeon, but was not recommended to have surgery. Since that time, he has had periodic back and leg discomfort. He had his first injections and nonoperative work-up in 2008. He had ongoing shots every couple of years until this year, when he has had 3 injections. He has not gotten relief of his pain. When his pain is bad, he has back pain as well as pain extending into his legs. Pain goes to his anterior thighs and sometimes to his right ankle. He does get some numbness when he stands up. His wife notices that he takes a significant amount of time to stand straight. Once he is active, his pain usually gets a bit better. Worst is sitting in 1 position for a long period of time. He also reports that he has had some trips and that he has some trouble lifting up his right ankle when he is having severe pain. He also reports some subjective weakness in his legs. He denies bowel  or bladder dysfunction.  Review of Systems:  A 10 point review of systems is negative, except for the pertinent positives and negatives detailed in the HPI.  Past Medical History: Past Medical History:  Diagnosis Date   Anxiety    Atrial fibrillation ([place])    Depression    Hypertriglyceridemia     Hypothyroidism    Obesity    Pulmonary hypertension ([place])    RAD (reactive airway disease)    Rhinitis, allergic    Sleep apnea     Past Surgical History: Past Surgical History:  Procedure Laterality Date   arthroscopic knee Bilateral    CARDIAC ELECTROPHYSIOLOGY STUDY AND ABLATION     COLONOSCOPY WITH PROPOFOL N/A 02/11/2016   Procedure: COLONOSCOPY WITH PROPOFOL;  Surgeon: Lollie Sails, MD;  Location: [facility] ENDOSCOPY;  Service: Endoscopy;  Laterality: N/A;   fistulla repair      Allergies: Allergies as of 04/15/2022 - Review Complete 01/30/2022  Allergen Reaction Noted   Chlorhexidine Dermatitis, Itching, Rash, and Swelling 11/24/2016   Adhesive [tape] Dermatitis 02/08/2016   Latex Dermatitis 02/08/2016   Tetracyclines & related     Citalopram Palpitations 02/08/2016   Povidone iodine Rash     Medications: No outpatient medications have been marked as taking for the 04/15/22 encounter (Appointment) with Meade Maw, MD.    Social History: Social History   Tobacco Use   Smoking status: Former   Smokeless tobacco: Current    Types: Chew  Substance Use Topics   Alcohol use: Yes    Alcohol/week: 1.0 standard drink of alcohol    Types: 1 Cans of beer per week   Drug use: No    Family Medical History: Family History  Problem Relation Age of Onset   Cancer Father    Diabetes Sister    Diabetes Brother     Physical Examination: There were no vitals filed for this visit.  General: Patient is well developed, well nourished, calm, collected, and in no apparent distress. Attention to examination is appropriate.  Neck:   Supple.  Full range of motion.  Respiratory: Patient is breathing without any difficulty.   NEUROLOGICAL:     Awake, alert, oriented to person, place, and time.  Speech is clear and fluent.   Cranial Nerves: Pupils equal round and reactive to light.  Facial tone is symmetric.  Facial sensation is symmetric. Shoulder shrug is  symmetric. Tongue protrusion is midline.  There is no pronator drift.  ROM of spine: full.    Strength: Side Biceps Triceps Deltoid Interossei Grip Wrist Ext. Wrist Flex.  R 5 5 5 5 5 5 5  $ L 5 5 5 5 5 5 5   $ Side Iliopsoas Quads Hamstring PF DF EHL  R 5 5 5 5 5 5  $ L 5 5 5 5 5 5   $ Reflexes are ***2+ and symmetric at the biceps, triceps, brachioradialis, patella and achilles.   Hoffman's is absent.   Bilateral upper and lower extremity sensation is intact to light touch.    No evidence of dysmetria noted.  Gait is normal.     Medical Decision Making  Imaging: ***  I have personally reviewed the images and agree with the above interpretation.  Assessment and Plan: Mr. Zuba is a pleasant 62 y.o. male with ***    Thank you for involving me in the care of this patient.      Kyndal Gloster K. Izora Ribas MD, [facility] Neurosurgery

## 2022-04-14 NOTE — H&P (View-Only) (Signed)
Referring Physician:  Loleta Dicker, [address] [place],  [place] [address]  Primary Physician:  Noel Journey, MD  History of Present Illness: 04/15/2022 Robert Green is here today with a chief complaint of back and right leg pain.  He has pain down his right leg into his anterior thigh as well as his lateral thigh and lateral calf.  He has been having worsening symptoms over the last 3 months.  He had a prior episode with this in 2019, but improved with conservative management.  He is now the point where he is unable to go about his day-to-day life without pain.  Standing, walking, bending, and climbing stairs make it worse.  Holding any object with significant weight also causes pain to get worse.  Medication helps a small amount.  His symptoms get worse through the day.  Conservative measures:  Physical therapy:  [facility]/11/22 to 03/28/22. Helping with mobility, but not pain Multimodal medical therapy including regular antiinflammatories:  flexeril, diclofenac, ibuprofen, meloxicam, tramadol, gabapentin  Injections:  has received epidural steroid injections 03/11/22: Bilateral SI joint injection (Dr. Fletcher Anon) - did not help  Past Surgery: denies  Robert Green has no symptoms of cervical myelopathy.  The symptoms are causing a significant impact on the patient's life.   I have utilized the care everywhere function in epic to review the outside records available from external health systems.  Progress Note from Cooper Render, [place] on 01/30/22: History of Present Illness: 01/30/2022 Robert Green is a 62 y.o with a history of peripheral neuropathy,OA, hypothyroidism, GERD, and chronic lumbosacral complaints who is here today with a chief complaint of acute worsening of chronic lumbosacral complaints. He states this started about 6 weeks ago as he is currently renovating a house. He describes worsening low back pain  that initially radiated across his low back and down his right thigh.He admits to increased left foot pain and numbness in his right heel over the last 6 weeks. In addition to these symptoms he states he has had more urinary urgency but denies any incontinence or loss of sensation. It has since improved some but is still significant and effecting his mobility and quality of life. He has had similar symptoms in the past and underwent multiple injections. He ultimately got relief from RFAs and expresses interest in attempting these again.  Of note, he admits to daily use of chewing tobacco   LOV 01/29/2018 with Dr. Izora Ribas Robert Green is here today with a chief complaint of back and leg discomfort. He first had a back injury while playing football in the late 1970s. He is then had a work injury in the mid 1990s while twisting holding a crate. He fell down to the ground with back pain and took 5 minutes to get up. He had severe pain, saw surgeon, but was not recommended to have surgery. Since that time, he has had periodic back and leg discomfort. He had his first injections and nonoperative work-up in 2008. He had ongoing shots every couple of years until this year, when he has had 3 injections. He has not gotten relief of his pain. When his pain is bad, he has back pain as well as pain extending into his legs. Pain goes to his anterior thighs and sometimes to his right ankle. He does get some numbness when he stands up. His wife notices that he takes a significant amount of time  to stand straight. Once he is active, his pain usually gets a bit better. Worst is sitting in 1 position for a long period of time. He also reports that he has had some trips and that he has some trouble lifting up his right ankle when he is having severe pain. He also reports some subjective weakness in his legs. He denies bowel or bladder dysfunction.  Review of Systems:  A 10 point review of systems is negative, except for the  pertinent positives and negatives detailed in the HPI.  Past Medical History: Past Medical History:  Diagnosis Date   Anxiety    Atrial fibrillation ([place])    Depression    Hypertriglyceridemia    Hypothyroidism    Obesity    Pulmonary hypertension ([place])    RAD (reactive airway disease)    Rhinitis, allergic    Sleep apnea     Past Surgical History: Past Surgical History:  Procedure Laterality Date   arthroscopic knee Bilateral    CARDIAC ELECTROPHYSIOLOGY STUDY AND ABLATION     COLONOSCOPY WITH PROPOFOL N/A 02/11/2016   Procedure: COLONOSCOPY WITH PROPOFOL;  Surgeon: Lollie Sails, MD;  Location: [facility] ENDOSCOPY;  Service: Endoscopy;  Laterality: N/A;   fistulla repair      Allergies: Allergies as of 04/15/2022 - Review Complete 04/15/2022  Allergen Reaction Noted   Chlorhexidine Dermatitis, Itching, Rash, and Swelling 11/24/2016   Adhesive [tape] Dermatitis 02/08/2016   Latex Dermatitis 02/08/2016   Tetracyclines & related     Citalopram Palpitations 02/08/2016    Medications: No outpatient medications have been marked as taking for the 04/15/22 encounter (Office Visit) with Meade Maw, MD.    Social History: Social History   Tobacco Use   Smoking status: Former   Smokeless tobacco: Former    Types: Chew    Quit date: 03/04/2022  Vaping Use   Vaping Use: Never used  Substance Use Topics   Alcohol use: Yes    Alcohol/week: 1.0 standard drink of alcohol    Types: 1 Cans of beer per week   Drug use: No    Family Medical History: Family History  Problem Relation Age of Onset   Cancer Father    Diabetes Sister    Diabetes Brother     Physical Examination: Vitals:   04/15/22 1421  BP: (!) 173/99  Pulse: (!) 58    General: Patient is well developed, well nourished, calm, collected, and in no apparent distress. Attention to examination is appropriate.  Neck:   Supple.  Full range of motion.  Respiratory: Patient is breathing without any  difficulty.   NEUROLOGICAL:     Awake, alert, oriented to person, place, and time.  Speech is clear and fluent.   Cranial Nerves: Pupils equal round and reactive to light.  Facial tone is symmetric.  Facial sensation is symmetric. Shoulder shrug is symmetric. Tongue protrusion is midline.  There is no pronator drift.  ROM of spine: full.    Strength: Side Biceps Triceps Deltoid Interossei Grip Wrist Ext. Wrist Flex.  R '5 5 5 5 5 5 5  '$ L '5 5 5 5 5 5 5   '$ Side Iliopsoas Quads Hamstring PF DF EHL  R '5 5 5 5 5 5  '$ L '5 5 5 5 5 5   '$ Reflexes are 1+ and symmetric at the biceps, triceps, brachioradialis, patella and achilles.   Hoffman's is absent.   Bilateral upper and lower extremity sensation is intact to light touch.  No evidence of dysmetria noted.  Gait is normal.    +SLR on R.   Medical Decision Making  Imaging: MRI L spine 02/08/2022 1. Severe canal stenosis at L2-L3.  2. Moderate canal stenosis at L3-L4.  3. Bilateral subarticular recess narrowing at L4-L5.  4. Multilevel neural foraminal narrowing, detailed above.   Signed (Electronic Signature): 02/10/2022 10:52 AM  Signed By: Cora Daniels   I have personally reviewed the images and agree with the above interpretation.  Assessment and Plan: Robert Green is a pleasant 62 y.o. male with severe lumbar stenosis at L2-3, moderate stenosis at L3-4, and subarticular narrowing at L4-5.  He has symptoms of neurogenic claudication with components because well through these levels.  He has tried and failed conservative management including physical therapy, time, medications, and injections.  At this point, no further conservative management is indicated.  I have recommended L2-5 decompression.  I discussed the planned procedure at length with the patient, including the risks, benefits, alternatives, and indications. The risks discussed include but are not limited to bleeding, infection, need for reoperation, spinal fluid  leak, stroke, vision loss, anesthetic complication, coma, paralysis, and even death. I also described in detail that improvement was not guaranteed.  The patient expressed understanding of these risks, and asked that we proceed with surgery. I described the surgery in layman's terms, and gave ample opportunity for questions, which were answered to the best of my ability.   I spent a total of 30 minutes in this patient's care today. This time was spent reviewing pertinent records including imaging studies, obtaining and confirming history, performing a directed evaluation, formulating and discussing my recommendations, and documenting the visit within the medical record.    Thank you for involving me in the care of this patient.      Kiyonna Tortorelli K. Izora Ribas MD, [facility] Neurosurgery

## 2022-04-15 ENCOUNTER — Ambulatory Visit: Payer: BC Managed Care – PPO | Admitting: Neurosurgery

## 2022-04-15 ENCOUNTER — Encounter: Payer: Self-pay | Admitting: Neurosurgery

## 2022-04-15 VITALS — BP 173/99 | HR 58 | Ht 76.0 in | Wt 351.2 lb

## 2022-04-15 DIAGNOSIS — M48062 Spinal stenosis, lumbar region with neurogenic claudication: Secondary | ICD-10-CM

## 2022-04-15 NOTE — Patient Instructions (Signed)
Please see below for information in regards to your upcoming surgery:  **we are planning to move our office location mid-March. Please check with [place] to see if we have moved to our new location prior to coming to your post op appointments after surgery. Our phone number will remain the same [phone]). Our new address will be: [facility] [address] [place], [place] [address]  Planned surgery: L2-5 decompression   Surgery date: 05/05/22 - you will find out your arrival time the business day before your surgery.   Pre-op appointment at [facility]: we will call you with a date/time for this. Pre-admit testing is located on the first floor of the [facility], [place], [address]. Please bring all prescriptions in the original prescription bottles to your appointment, even if you have reviewed medications by phone with a pharmacy representative. Pre-op labs may be done at your pre-op appointment. You are not required to fast for these labs. Should you need to change your pre-op appointment, please call Pre-admit testing at [phone].     If you have FMLA/disability paperwork, please drop it off or fax it to [phone], attention [place].   We can be reached by phone or mychart 8am-4pm, Monday-Friday. If you have any questions/concerns before or after surgery, you can reach [place] at [phone], or you can send a mychart message. If you have a concern after hours that cannot wait until normal business hours, you can call [phone] and ask to page the neurosurgeon on call for [place].    Appointments/FMLA & disability paperwork: [place]  Nurse: Ophelia Shoulder  Medical assistants: Lum Keas Physician Assistant's: [place] Surgeon: Meade Maw, MD

## 2022-04-16 ENCOUNTER — Other Ambulatory Visit: Payer: Self-pay

## 2022-04-16 ENCOUNTER — Telehealth: Payer: Self-pay

## 2022-04-16 DIAGNOSIS — Z01818 Encounter for other preprocedural examination: Secondary | ICD-10-CM

## 2022-04-16 NOTE — Telephone Encounter (Signed)
I spoke with Mr and Mrs Bartles. Given that they live about 2.5 hours away, I recommended filling his post op medications at the [facility] or another local pharmacy prior to driving home from the hospital to minimize issues with filling his prescriptions later in the day after driving 2.5 hours. I also explained that with him staying for 23 hour observation, our team will work on making sure that his pain is under control prior to discharge. I informed him of the medications that are typically prescribed at discharge.

## 2022-04-16 NOTE — Telephone Encounter (Signed)
-----   Message from Orson Eva sent at 04/16/2022  4:42 PM EST ----- Regarding: Pain management  ----- Message ----- From: Orson Eva Sent: 04/16/2022   4:37 PM EST To: Rae Lips, [place]; Berdine Addison, RN  Would like you to go over what the pain management would be post op before her surgery.  Stated that his last surgery they were unaware and by the time they were able to get the pain meds and the correct regiment, he was hospitalized for 4 days trying to gt ahead of the pain.  [id number]

## 2022-04-22 ENCOUNTER — Other Ambulatory Visit: Payer: Self-pay

## 2022-04-23 ENCOUNTER — Encounter
Admission: RE | Admit: 2022-04-23 | Discharge: 2022-04-23 | Disposition: A | Discharge status: Home / Self Care | Payer: BC Managed Care – PPO | Source: Ambulatory Visit | Attending: Neurosurgery | Admitting: Neurosurgery

## 2022-04-23 DIAGNOSIS — I272 Pulmonary hypertension, unspecified: Secondary | ICD-10-CM | POA: Diagnosis not present

## 2022-04-23 DIAGNOSIS — J449 Chronic obstructive pulmonary disease, unspecified: Secondary | ICD-10-CM | POA: Diagnosis not present

## 2022-04-23 DIAGNOSIS — Z01818 Encounter for other preprocedural examination: Secondary | ICD-10-CM | POA: Diagnosis present

## 2022-04-23 DIAGNOSIS — R001 Bradycardia, unspecified: Secondary | ICD-10-CM | POA: Diagnosis not present

## 2022-04-23 DIAGNOSIS — Z01812 Encounter for preprocedural laboratory examination: Secondary | ICD-10-CM

## 2022-04-23 DIAGNOSIS — I4891 Unspecified atrial fibrillation: Secondary | ICD-10-CM

## 2022-04-23 HISTORY — DX: Chronic obstructive pulmonary disease, unspecified: J44.9

## 2022-04-23 HISTORY — DX: Other complications of anesthesia, initial encounter: T88.59XA

## 2022-04-23 HISTORY — DX: Morbid (severe) obesity due to excess calories: E66.01

## 2022-04-23 HISTORY — DX: Pneumonia, unspecified organism: J18.9

## 2022-04-23 HISTORY — DX: Spinal stenosis, lumbar region without neurogenic claudication: M48.061

## 2022-04-23 HISTORY — DX: Gastro-esophageal reflux disease without esophagitis: K21.9

## 2022-04-23 HISTORY — DX: Spondylosis without myelopathy or radiculopathy, lumbosacral region: M47.817

## 2022-04-23 LAB — CBC
HCT: 46.7 % (ref 39.0–52.0)
Hemoglobin: 15.5 g/dL (ref 13.0–17.0)
MCH: 30.2 pg (ref 26.0–34.0)
MCHC: 33.2 g/dL (ref 30.0–36.0)
MCV: 91 fL (ref 80.0–100.0)
Platelets: 213 10*3/uL (ref 150–400)
RBC: 5.13 MIL/uL (ref 4.22–5.81)
RDW: 12.4 % (ref 11.5–15.5)
WBC: 8.4 10*3/uL (ref 4.0–10.5)
nRBC: 0 % (ref 0.0–0.2)

## 2022-04-23 LAB — BASIC METABOLIC PANEL
Anion gap: 7 (ref 5–15)
BUN: 14 mg/dL (ref 8–23)
CO2: 25 mmol/L (ref 22–32)
Calcium: 9.3 mg/dL (ref 8.9–10.3)
Chloride: 107 mmol/L (ref 98–111)
Creatinine, Ser: 1.1 mg/dL (ref 0.61–1.24)
GFR, Estimated: >60 mL/min (ref >60)
Glucose, Bld: 96 mg/dL (ref 70–99)
Potassium: 4 mmol/L (ref 3.5–5.1)
Sodium: 139 mmol/L (ref 135–145)

## 2022-04-23 LAB — SURGICAL PCR SCREEN
MRSA, PCR: NEGATIVE
Staphylococcus aureus: NEGATIVE

## 2022-04-23 LAB — URINALYSIS, ROUTINE W REFLEX MICROSCOPIC
Bilirubin Urine: NEGATIVE
Glucose, UA: NEGATIVE mg/dL
Hgb urine dipstick: NEGATIVE
Ketones, ur: NEGATIVE mg/dL
Leukocytes,Ua: NEGATIVE
Nitrite: NEGATIVE
Protein, ur: NEGATIVE mg/dL
Specific Gravity, Urine: 1.021 (ref 1.005–1.030)
pH: 5 (ref 5.0–8.0)

## 2022-04-23 LAB — TYPE AND SCREEN
ABO/RH(D): O POS
Antibody Screen: NEGATIVE

## 2022-04-23 NOTE — Patient Instructions (Addendum)
Your procedure is scheduled on: Monday, March 4 Report to the Registration Desk on the 1st floor of the [facility]. To find out your arrival time, please call [phone] between 1PM - 3PM on: Friday, March 1 If your arrival time is 6:00 am, do not arrive before that time as the [facility] entrance doors do not open until 6:00 am.  REMEMBER: Instructions that are not followed completely may result in serious medical risk, up to and including death; or upon the discretion of your surgeon and anesthesiologist your surgery may need to be rescheduled.  Do not eat food after midnight the night before surgery.  No gum chewing or hard candies.  You may however, drink CLEAR liquids up to 2 hours before you are scheduled to arrive for your surgery. Do not drink anything within 2 hours of your scheduled arrival time.  Clear liquids include: - water  - apple juice without pulp - gatorade (not RED colors) - black coffee or tea (Do NOT add milk or creamers to the coffee or tea) Do NOT drink anything that is not on this list.  One week prior to surgery: starting today, February 28 Stop meloxicam and Anti-inflammatories (NSAIDS) such as Advil, Aleve, Ibuprofen, Motrin, Naproxen, Naprosyn and Aspirin based products such as Excedrin, Goody's Powder, BC Powder. Stop ANY OVER THE COUNTER supplements until after surgery. You may however, continue to take Tylenol if needed for pain up until the day of surgery.  Continue taking all prescribed medications   TAKE ONLY THESE MEDICATIONS THE MORNING OF SURGERY WITH A SIP OF WATER:  Synthroid Pantoprazole (protonix) - (take one the night before and one on the morning of surgery - helps to prevent nausea after surgery.) Advair inhaler Tramadol if needed for pain  Use inhalers on the day of surgery and bring your albuterol inhaler to the hospital.  No Alcohol for 24 hours before or after surgery.  No Smoking including e-cigarettes for 24 hours before  surgery.  No chewable tobacco products for at least 6 hours before surgery.  No nicotine patches on the day of surgery.  Do not use any "recreational" drugs for at least a week (preferably 2 weeks) before your surgery.  Please be advised that the combination of cocaine and anesthesia may have negative outcomes, up to and including death. If you test positive for cocaine, your surgery will be cancelled.  On the morning of surgery brush your teeth with toothpaste and water, you may rinse your mouth with mouthwash if you wish. Do not swallow any toothpaste or mouthwash.  Shower using the yellow dial soap before coming to the hospital.  Do not wear jewelry, make-up, hairpins, clips or nail polish.  Do not wear lotions, powders, or perfumes.   Do not shave body hair from the neck down 48 hours before surgery.  Contact lenses, hearing aids and dentures may not be worn into surgery.  Do not bring valuables to the hospital. [facility] is not responsible for any missing/lost belongings or valuables.   Bring your C-PAP to the hospital in case you may have to spend the night.   Notify your doctor if there is any change in your medical condition (cold, fever, infection).  Wear comfortable clothing (specific to your surgery type) to the hospital.  After surgery, you can help prevent lung complications by doing breathing exercises.  Take deep breaths and cough every 1-2 hours. Your doctor may order a device called an Incentive Spirometer to help you  take deep breaths.  If you are being admitted to the hospital overnight, leave your suitcase in the car. After surgery it may be brought to your room.  In case of increased patient census, it may be necessary for you, the patient, to continue your postoperative care in the Same Day Surgery department.  If you are being discharged the day of surgery, you will not be allowed to drive home. You will need a responsible individual to drive you home and  stay with you for 24 hours after surgery.   If you are taking public transportation, you will need to have a responsible individual with you.  Please call the [place] Dept. at [phone] if you have any questions about these instructions.  Surgery Visitation Policy:  Patients undergoing a surgery or procedure may have two family members or support persons with them as long as the person is not COVID-19 positive or experiencing its symptoms.   Inpatient Visitation:    Visiting hours are 7 a.m. to 8 p.m. Up to four visitors are allowed at one time in a patient room. The visitors may rotate out with other people during the day. One designated support person (adult) may remain overnight.  Due to an increase in RSV and influenza rates and associated hospitalizations, children ages 46 and under will not be able to visit patients in [facility]. Masks continue to be strongly recommended.

## 2022-04-27 ENCOUNTER — Encounter: Payer: Self-pay | Admitting: Neurosurgery

## 2022-04-27 MED ORDER — LACTATED RINGERS IV SOLN: INTRAVENOUS | Status: DC

## 2022-04-27 MED ORDER — CEFAZOLIN IN SODIUM CHLORIDE 2-0.9 GM/100ML-% IV SOLN
3.0000 g | Freq: Once | INTRAVENOUS | Status: DC
  Filled 2022-04-27: qty 200

## 2022-04-27 MED ORDER — CEFAZOLIN IN SODIUM CHLORIDE 3-0.9 GM/100ML-% IV SOLN
3.0000 g | INTRAVENOUS | Status: AC
  Administered 2022-04-28: 3 g via INTRAVENOUS
  Filled 2022-04-27 (×2): qty 100

## 2022-04-28 ENCOUNTER — Other Ambulatory Visit: Payer: Self-pay

## 2022-04-28 ENCOUNTER — Encounter: Payer: Self-pay | Admitting: Neurosurgery

## 2022-04-28 ENCOUNTER — Ambulatory Visit: Payer: BC Managed Care – PPO

## 2022-04-28 ENCOUNTER — Ambulatory Visit: Payer: BC Managed Care – PPO | Admitting: Certified Registered"

## 2022-04-28 ENCOUNTER — Observation Stay
Admission: RE | Admit: 2022-04-28 | Discharge: 2022-04-29 | Disposition: A | Discharge status: Home / Self Care | Payer: BC Managed Care – PPO | Attending: Neurosurgery | Admitting: Neurosurgery

## 2022-04-28 ENCOUNTER — Encounter
Admission: RE | Disposition: A | Discharge status: Home / Self Care | Payer: Self-pay | Source: Home / Self Care | Attending: Neurosurgery

## 2022-04-28 DIAGNOSIS — M48062 Spinal stenosis, lumbar region with neurogenic claudication: Principal | ICD-10-CM

## 2022-04-28 DIAGNOSIS — Z01812 Encounter for preprocedural laboratory examination: Secondary | ICD-10-CM

## 2022-04-28 DIAGNOSIS — I4891 Unspecified atrial fibrillation: Secondary | ICD-10-CM | POA: Diagnosis not present

## 2022-04-28 DIAGNOSIS — I272 Pulmonary hypertension, unspecified: Secondary | ICD-10-CM

## 2022-04-28 DIAGNOSIS — Z01818 Encounter for other preprocedural examination: Secondary | ICD-10-CM

## 2022-04-28 DIAGNOSIS — J449 Chronic obstructive pulmonary disease, unspecified: Secondary | ICD-10-CM

## 2022-04-28 DIAGNOSIS — Z87891 Personal history of nicotine dependence: Secondary | ICD-10-CM | POA: Insufficient documentation

## 2022-04-28 DIAGNOSIS — I10 Essential (primary) hypertension: Secondary | ICD-10-CM | POA: Insufficient documentation

## 2022-04-28 DIAGNOSIS — E039 Hypothyroidism, unspecified: Secondary | ICD-10-CM | POA: Insufficient documentation

## 2022-04-28 DIAGNOSIS — M48061 Spinal stenosis, lumbar region without neurogenic claudication: Secondary | ICD-10-CM | POA: Diagnosis present

## 2022-04-28 HISTORY — PX: LUMBAR LAMINECTOMY/DECOMPRESSION MICRODISCECTOMY: SHX5026

## 2022-04-28 HISTORY — PX: REPAIR OF CEREBROSPINAL FLUID LEAK: SHX6322

## 2022-04-28 LAB — ABO/RH: ABO/RH(D): O POS

## 2022-04-28 SURGERY — LUMBAR LAMINECTOMY/DECOMPRESSION MICRODISCECTOMY 3 LEVELS
Anesthesia: General | Site: Back

## 2022-04-28 MED ORDER — OXYCODONE HCL 5 MG PO TABS
10.0000 mg | ORAL_TABLET | ORAL | Status: DC | PRN
  Administered 2022-04-28 – 2022-04-29 (×6): 10 mg via ORAL
  Filled 2022-04-28 (×6): qty 2

## 2022-04-28 MED ORDER — KETAMINE HCL 50 MG/5ML IJ SOSY
PREFILLED_SYRINGE | INTRAMUSCULAR | Status: AC
  Filled 2022-04-28: qty 5

## 2022-04-28 MED ORDER — METHYLPREDNISOLONE ACETATE 40 MG/ML IJ SUSP
INTRAMUSCULAR | Status: AC
  Filled 2022-04-28: qty 1

## 2022-04-28 MED ORDER — MIDAZOLAM HCL 2 MG/2ML IJ SOLN
INTRAMUSCULAR | Status: AC
  Filled 2022-04-28: qty 2

## 2022-04-28 MED ORDER — METHOCARBAMOL 1000 MG/10ML IJ SOLN
500.0000 mg | Freq: Four times a day (QID) | INTRAVENOUS | Status: DC | PRN
  Administered 2022-04-28: 500 mg via INTRAVENOUS
  Filled 2022-04-28: qty 500
  Filled 2022-04-28: qty 5

## 2022-04-28 MED ORDER — HYDROMORPHONE HCL 1 MG/ML IJ SOLN
INTRAMUSCULAR | Status: DC | PRN
  Administered 2022-04-28: 1 mg via INTRAVENOUS
  Administered 2022-04-28: .5 mg via INTRAVENOUS
  Administered 2022-04-28 (×2): .25 mg via INTRAVENOUS

## 2022-04-28 MED ORDER — ONDANSETRON HCL 4 MG/2ML IJ SOLN
INTRAMUSCULAR | Status: DC | PRN
  Administered 2022-04-28: 4 mg via INTRAVENOUS

## 2022-04-28 MED ORDER — PHENOL 1.4 % MT LIQD: 1.0000 | OROMUCOSAL | Status: DC | PRN

## 2022-04-28 MED ORDER — ONDANSETRON HCL 4 MG PO TABS: 4.0000 mg | ORAL_TABLET | Freq: Four times a day (QID) | ORAL | Status: DC | PRN

## 2022-04-28 MED ORDER — SODIUM CHLORIDE 0.9% FLUSH
3.0000 mL | Freq: Two times a day (BID) | INTRAVENOUS | Status: DC
  Administered 2022-04-28 – 2022-04-29 (×3): 3 mL via INTRAVENOUS

## 2022-04-28 MED ORDER — SODIUM CHLORIDE 0.9 % IV SOLN: 250.0000 mL | INTRAVENOUS | Status: DC

## 2022-04-28 MED ORDER — BUPIVACAINE HCL (PF) 0.5 % IJ SOLN
INTRAMUSCULAR | Status: AC
  Filled 2022-04-28: qty 60

## 2022-04-28 MED ORDER — SENNA 8.6 MG PO TABS
1.0000 | ORAL_TABLET | Freq: Two times a day (BID) | ORAL | Status: DC
  Administered 2022-04-28 – 2022-04-29 (×2): 8.6 mg via ORAL
  Filled 2022-04-28 (×2): qty 1

## 2022-04-28 MED ORDER — SUCCINYLCHOLINE CHLORIDE 200 MG/10ML IV SOSY
PREFILLED_SYRINGE | INTRAVENOUS | Status: DC | PRN
  Administered 2022-04-28: 140 mg via INTRAVENOUS

## 2022-04-28 MED ORDER — FLUTICASONE FUROATE-VILANTEROL 100-25 MCG/ACT IN AEPB
1.0000 | INHALATION_SPRAY | Freq: Every day | RESPIRATORY_TRACT | Status: DC
  Administered 2022-04-29: 1 via RESPIRATORY_TRACT
  Filled 2022-04-28: qty 28

## 2022-04-28 MED ORDER — ACETAMINOPHEN 500 MG PO TABS
1000.0000 mg | ORAL_TABLET | Freq: Four times a day (QID) | ORAL | Status: DC
  Administered 2022-04-28 – 2022-04-29 (×4): 1000 mg via ORAL
  Filled 2022-04-28 (×4): qty 2

## 2022-04-28 MED ORDER — PANTOPRAZOLE SODIUM 40 MG PO TBEC: 40.0000 mg | DELAYED_RELEASE_TABLET | Freq: Every day | ORAL | Status: DC | PRN

## 2022-04-28 MED ORDER — BUPIVACAINE-EPINEPHRINE (PF) 0.5% -1:200000 IJ SOLN
INTRAMUSCULAR | Status: DC | PRN
  Administered 2022-04-28: 10 mL

## 2022-04-28 MED ORDER — LIDOCAINE HCL (CARDIAC) PF 100 MG/5ML IV SOSY
PREFILLED_SYRINGE | INTRAVENOUS | Status: DC | PRN
  Administered 2022-04-28: 80 mg via INTRAVENOUS

## 2022-04-28 MED ORDER — KETOROLAC TROMETHAMINE 30 MG/ML IJ SOLN
INTRAMUSCULAR | Status: DC | PRN
  Administered 2022-04-28: 30 mg via INTRAVENOUS

## 2022-04-28 MED ORDER — LORATADINE 10 MG PO TABS
10.0000 mg | ORAL_TABLET | Freq: Every day | ORAL | Status: DC
  Administered 2022-04-29: 10 mg via ORAL
  Filled 2022-04-28: qty 1

## 2022-04-28 MED ORDER — MAGNESIUM CITRATE PO SOLN: 1.0000 | Freq: Once | ORAL | Status: DC | PRN

## 2022-04-28 MED ORDER — DEXAMETHASONE SODIUM PHOSPHATE 10 MG/ML IJ SOLN
INTRAMUSCULAR | Status: DC | PRN
  Administered 2022-04-28: 10 mg via INTRAVENOUS

## 2022-04-28 MED ORDER — FENTANYL CITRATE (PF) 100 MCG/2ML IJ SOLN
25.0000 ug | INTRAMUSCULAR | Status: DC | PRN
  Administered 2022-04-28 (×4): 25 ug via INTRAVENOUS

## 2022-04-28 MED ORDER — ACETAMINOPHEN 10 MG/ML IV SOLN
INTRAVENOUS | Status: DC | PRN
  Administered 2022-04-28: 1000 mg via INTRAVENOUS

## 2022-04-28 MED ORDER — LIDOCAINE HCL (PF) 2 % IJ SOLN
INTRAMUSCULAR | Status: AC
  Filled 2022-04-28: qty 5

## 2022-04-28 MED ORDER — MORPHINE SULFATE (PF) 2 MG/ML IV SOLN
2.0000 mg | INTRAVENOUS | Status: AC | PRN
  Administered 2022-04-28 – 2022-04-29 (×4): 2 mg via INTRAVENOUS
  Filled 2022-04-28 (×5): qty 1

## 2022-04-28 MED ORDER — DEXAMETHASONE SODIUM PHOSPHATE 10 MG/ML IJ SOLN
INTRAMUSCULAR | Status: AC
  Filled 2022-04-28: qty 1

## 2022-04-28 MED ORDER — BISACODYL 10 MG RE SUPP: 10.0000 mg | Freq: Every day | RECTAL | Status: DC | PRN

## 2022-04-28 MED ORDER — KETAMINE HCL 10 MG/ML IJ SOLN
INTRAMUSCULAR | Status: DC | PRN
  Administered 2022-04-28 (×2): 20 mg via INTRAVENOUS
  Administered 2022-04-28: 50 mg via INTRAVENOUS

## 2022-04-28 MED ORDER — KETOROLAC TROMETHAMINE 15 MG/ML IJ SOLN
7.5000 mg | Freq: Four times a day (QID) | INTRAMUSCULAR | Status: AC
  Administered 2022-04-28 – 2022-04-29 (×4): 7.5 mg via INTRAVENOUS
  Filled 2022-04-28 (×4): qty 1

## 2022-04-28 MED ORDER — METHOCARBAMOL 500 MG PO TABS
500.0000 mg | ORAL_TABLET | Freq: Four times a day (QID) | ORAL | Status: DC | PRN
  Administered 2022-04-29: 500 mg via ORAL
  Filled 2022-04-28: qty 1

## 2022-04-28 MED ORDER — ENOXAPARIN SODIUM 40 MG/0.4ML IJ SOSY
40.0000 mg | PREFILLED_SYRINGE | INTRAMUSCULAR | Status: DC
  Administered 2022-04-29: 40 mg via SUBCUTANEOUS
  Filled 2022-04-28: qty 0.4

## 2022-04-28 MED ORDER — PROPOFOL 10 MG/ML IV BOLUS
INTRAVENOUS | Status: DC | PRN
  Administered 2022-04-28: 200 mg via INTRAVENOUS

## 2022-04-28 MED ORDER — METRONIDAZOLE 0.75 % EX GEL
1.0000 | Freq: Two times a day (BID) | CUTANEOUS | Status: DC
  Filled 2022-04-28: qty 1

## 2022-04-28 MED ORDER — ALBUTEROL SULFATE (2.5 MG/3ML) 0.083% IN NEBU: 3.0000 mL | INHALATION_SOLUTION | Freq: Four times a day (QID) | RESPIRATORY_TRACT | Status: DC | PRN

## 2022-04-28 MED ORDER — ONDANSETRON HCL 4 MG/2ML IJ SOLN: 4.0000 mg | Freq: Once | INTRAMUSCULAR | Status: DC | PRN

## 2022-04-28 MED ORDER — ONDANSETRON HCL 4 MG/2ML IJ SOLN: 4.0000 mg | Freq: Four times a day (QID) | INTRAMUSCULAR | Status: DC | PRN

## 2022-04-28 MED ORDER — METHYLPREDNISOLONE ACETATE 40 MG/ML IJ SUSP
INTRAMUSCULAR | Status: DC | PRN
  Administered 2022-04-28: 40 mg

## 2022-04-28 MED ORDER — DEXMEDETOMIDINE HCL IN NACL 80 MCG/20ML IV SOLN
INTRAVENOUS | Status: AC
  Filled 2022-04-28: qty 20

## 2022-04-28 MED ORDER — PROPOFOL 10 MG/ML IV BOLUS
INTRAVENOUS | Status: AC
  Filled 2022-04-28: qty 20

## 2022-04-28 MED ORDER — SODIUM CHLORIDE 0.9 % IV SOLN: INTRAVENOUS | Status: DC

## 2022-04-28 MED ORDER — FUROSEMIDE 20 MG PO TABS: 20.0000 mg | ORAL_TABLET | ORAL | Status: DC | PRN

## 2022-04-28 MED ORDER — SODIUM CHLORIDE 0.9% FLUSH: 3.0000 mL | INTRAVENOUS | Status: DC | PRN

## 2022-04-28 MED ORDER — MIDAZOLAM HCL 2 MG/2ML IJ SOLN
INTRAMUSCULAR | Status: DC | PRN
  Administered 2022-04-28: 2 mg via INTRAVENOUS

## 2022-04-28 MED ORDER — SODIUM CHLORIDE (PF) 0.9 % IJ SOLN
INTRAMUSCULAR | Status: DC | PRN
  Administered 2022-04-28: 60 mL

## 2022-04-28 MED ORDER — DOCUSATE SODIUM 100 MG PO CAPS
100.0000 mg | ORAL_CAPSULE | Freq: Two times a day (BID) | ORAL | Status: DC
  Administered 2022-04-28 – 2022-04-29 (×2): 100 mg via ORAL
  Filled 2022-04-28 (×2): qty 1

## 2022-04-28 MED ORDER — 0.9 % SODIUM CHLORIDE (POUR BTL) OPTIME
TOPICAL | Status: DC | PRN
  Administered 2022-04-28: 500 mL

## 2022-04-28 MED ORDER — SODIUM CHLORIDE FLUSH 0.9 % IV SOLN
INTRAVENOUS | Status: AC
  Filled 2022-04-28: qty 20

## 2022-04-28 MED ORDER — HYDROMORPHONE HCL 1 MG/ML IJ SOLN
INTRAMUSCULAR | Status: AC
  Filled 2022-04-28: qty 1

## 2022-04-28 MED ORDER — BUPIVACAINE LIPOSOME 1.3 % IJ SUSP
INTRAMUSCULAR | Status: AC
  Filled 2022-04-28: qty 20

## 2022-04-28 MED ORDER — OXYCODONE HCL 5 MG PO TABS
5.0000 mg | ORAL_TABLET | ORAL | Status: DC | PRN
  Administered 2022-04-29: 5 mg via ORAL
  Filled 2022-04-28: qty 1

## 2022-04-28 MED ORDER — DILTIAZEM HCL 30 MG PO TABS: 30.0000 mg | ORAL_TABLET | Freq: Four times a day (QID) | ORAL | Status: DC | PRN

## 2022-04-28 MED ORDER — KETOROLAC TROMETHAMINE 30 MG/ML IJ SOLN
INTRAMUSCULAR | Status: AC
  Filled 2022-04-28: qty 1

## 2022-04-28 MED ORDER — FIBRIN SEALANT 2 ML SINGLE DOSE KIT
PACK | CUTANEOUS | Status: DC | PRN
  Administered 2022-04-28: 2 mL via TOPICAL

## 2022-04-28 MED ORDER — SURGIFLO WITH THROMBIN (HEMOSTATIC MATRIX KIT) OPTIME
TOPICAL | Status: DC | PRN
  Administered 2022-04-28: 1 via TOPICAL

## 2022-04-28 MED ORDER — PHENYLEPHRINE HCL-NACL 20-0.9 MG/250ML-% IV SOLN
INTRAVENOUS | Status: DC | PRN
  Administered 2022-04-28: 30 ug/min via INTRAVENOUS

## 2022-04-28 MED ORDER — LEVOTHYROXINE SODIUM 137 MCG PO TABS
137.0000 ug | ORAL_TABLET | Freq: Every day | ORAL | Status: DC
  Administered 2022-04-28: 137 ug via ORAL
  Filled 2022-04-28: qty 1

## 2022-04-28 MED ORDER — DEXMEDETOMIDINE HCL IN NACL 80 MCG/20ML IV SOLN
INTRAVENOUS | Status: DC | PRN
  Administered 2022-04-28: 12 ug via BUCCAL
  Administered 2022-04-28: 8 ug via BUCCAL
  Administered 2022-04-28: 12 ug via BUCCAL
  Administered 2022-04-28: 8 ug via BUCCAL

## 2022-04-28 MED ORDER — ACETAMINOPHEN 10 MG/ML IV SOLN
INTRAVENOUS | Status: AC
  Filled 2022-04-28: qty 100

## 2022-04-28 MED ORDER — FENTANYL CITRATE (PF) 100 MCG/2ML IJ SOLN
INTRAMUSCULAR | Status: AC
  Filled 2022-04-28: qty 2

## 2022-04-28 MED ORDER — POLYETHYLENE GLYCOL 3350 17 G PO PACK: 17.0000 g | PACK | Freq: Every day | ORAL | Status: DC | PRN

## 2022-04-28 MED ORDER — EPINEPHRINE PF 1 MG/ML IJ SOLN
INTRAMUSCULAR | Status: AC
  Filled 2022-04-28: qty 1

## 2022-04-28 MED ORDER — MENTHOL 3 MG MT LOZG: 1.0000 | LOZENGE | OROMUCOSAL | Status: DC | PRN

## 2022-04-28 MED ORDER — PHENYLEPHRINE HCL-NACL 20-0.9 MG/250ML-% IV SOLN
INTRAVENOUS | Status: AC
  Filled 2022-04-28: qty 250

## 2022-04-28 MED ORDER — MONTELUKAST SODIUM 10 MG PO TABS
10.0000 mg | ORAL_TABLET | Freq: Every day | ORAL | Status: DC
  Administered 2022-04-28: 10 mg via ORAL
  Filled 2022-04-28: qty 1

## 2022-04-28 SURGICAL SUPPLY — 50 items
ADH SKN CLS APL DERMABOND .7 (GAUZE/BANDAGES/DRESSINGS) ×2
AGENT HMST KT MTR STRL THRMB (HEMOSTASIS) ×2
BASIN KIT SINGLE STR (MISCELLANEOUS) ×2 IMPLANT
BUR NEURO DRILL SOFT 3.0X3.8M (BURR) ×2 IMPLANT
CNTNR URN SCR LID CUP LEK RST (MISCELLANEOUS) ×2 IMPLANT
CONT SPEC 4OZ STRL OR WHT (MISCELLANEOUS) ×2
DERMABOND ADVANCED .7 DNX12 (GAUZE/BANDAGES/DRESSINGS) ×2 IMPLANT
DRAPE C ARM PK CFD 31 SPINE (DRAPES) ×2 IMPLANT
DRAPE LAPAROTOMY 100X77 ABD (DRAPES) ×2 IMPLANT
DRAPE MICROSCOPE SPINE 48X150 (DRAPES) ×2 IMPLANT
DRSG OPSITE POSTOP 4X8 (GAUZE/BANDAGES/DRESSINGS) IMPLANT
DRSG TEGADERM 2-3/8X2-3/4 SM (GAUZE/BANDAGES/DRESSINGS) IMPLANT
ELECT EZSTD 165MM 6.5IN (MISCELLANEOUS) ×2
ELECTRODE EZSTD 165MM 6.5IN (MISCELLANEOUS) ×2 IMPLANT
GAUZE 4X4 16PLY ~~LOC~~+RFID DBL (SPONGE) IMPLANT
GLOVE SURG SYN 6.5 ES PF (GLOVE) ×12 IMPLANT
GLOVE SURG SYN 6.5 PF PI (GLOVE) ×2 IMPLANT
GLOVE SURG SYN 8.5  E (GLOVE) ×6
GLOVE SURG SYN 8.5 E (GLOVE) ×6 IMPLANT
GLOVE SURG SYN 8.5 PF PI (GLOVE) ×6 IMPLANT
GLOVE SURG UNDER POLY LF SZ6.5 (GLOVE) ×2 IMPLANT
GOWN SRG LRG LVL 4 IMPRV REINF (GOWNS) ×2 IMPLANT
GOWN SRG XL LVL 3 NONREINFORCE (GOWNS) ×2 IMPLANT
GOWN STRL NON-REIN TWL XL LVL3 (GOWNS) ×2
GOWN STRL REIN LRG LVL4 (GOWNS) ×2
GOWN STRL REUS W/ TWL XL LVL3 (GOWN DISPOSABLE) ×2 IMPLANT
GOWN STRL REUS W/TWL XL LVL3 (GOWN DISPOSABLE) ×2
GRAFT DURAGEN MATRIX 1WX1L (Tissue) IMPLANT
HEMOVAC 400CC 10FR (MISCELLANEOUS) IMPLANT
HOLDER FOLEY CATH W/STRAP (MISCELLANEOUS) IMPLANT
KIT SPINAL PRONEVIEW (KITS) ×2 IMPLANT
MANIFOLD NEPTUNE II (INSTRUMENTS) ×2 IMPLANT
NDL SAFETY ECLIP 18X1.5 (MISCELLANEOUS) IMPLANT
NS IRRIG 1000ML POUR BTL (IV SOLUTION) ×2 IMPLANT
PACK LAMINECTOMY NEURO (CUSTOM PROCEDURE TRAY) ×2 IMPLANT
PAD ARMBOARD 7.5X6 YLW CONV (MISCELLANEOUS) ×2 IMPLANT
SPONGE GAUZE 2X2 8PLY STRL LF (GAUZE/BANDAGES/DRESSINGS) IMPLANT
SURGIFLO W/THROMBIN 8M KIT (HEMOSTASIS) ×2 IMPLANT
SUT DVC VLOC 3-0 CL 6 P-12 (SUTURE) ×2 IMPLANT
SUT ETHILON 3-0 FS-10 30 BLK (SUTURE) ×6
SUT VIC AB 0 CT1 27 (SUTURE) ×4
SUT VIC AB 0 CT1 27XCR 8 STRN (SUTURE) ×2 IMPLANT
SUT VIC AB 2-0 CT1 18 (SUTURE) ×2 IMPLANT
SUTURE EHLN 3-0 FS-10 30 BLK (SUTURE) IMPLANT
SYR 10ML LL (SYRINGE) ×2 IMPLANT
SYR 30ML LL (SYRINGE) ×4 IMPLANT
SYR 3ML LL SCALE MARK (SYRINGE) ×2 IMPLANT
TRAP FLUID SMOKE EVACUATOR (MISCELLANEOUS) ×2 IMPLANT
TRAY FOLEY SLVR 16FR LF STAT (SET/KITS/TRAYS/PACK) IMPLANT
WATER STERILE IRR 1000ML POUR (IV SOLUTION) ×4 IMPLANT

## 2022-04-28 NOTE — Interval H&P Note (Signed)
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  The various methods of treatment have been discussed with the patient and family. After consideration of risks, benefits and other options for treatment, the patient has consented to  Procedure(s): L2-5 DECOMPRESSION (N/A) as a surgical intervention.  The patient's history has been reviewed, patient examined, no change in status, stable for surgery.  I have reviewed the patient's chart and labs.  Questions were answered to the patient's satisfaction.    Heart sounds normal no MRG. Chest Clear to Auscultation Bilaterally.  Robert Green

## 2022-04-28 NOTE — Progress Notes (Signed)
Patient refused the TED hoses.  Patient states that they are uncomfortable.  Patient does have on SCDs.

## 2022-04-28 NOTE — Anesthesia Procedure Notes (Signed)
Procedure Name: Intubation Date/Time: 04/28/2022 9:32 AM  Performed by: Esaw Grandchild, CRNAPre-anesthesia Checklist: Patient identified, Emergency Drugs available, Suction available and Patient being monitored Patient Re-evaluated:Patient Re-evaluated prior to induction Oxygen Delivery Method: Circle system utilized Preoxygenation: Pre-oxygenation with 100% oxygen Induction Type: IV induction Ventilation: Mask ventilation without difficulty Laryngoscope Size: Miller and 2 Grade View: Grade I Tube type: Oral Tube size: 8.0 mm Number of attempts: 1 Airway Equipment and Method: Stylet, Oral airway, Bite block and LTA kit utilized Placement Confirmation: ETT inserted through vocal cords under direct vision, positive ETCO2 and breath sounds checked- equal and bilateral Secured at: 24 cm Tube secured with: Tape Dental Injury: Teeth and Oropharynx as per pre-operative assessment

## 2022-04-28 NOTE — Transfer of Care (Signed)
Immediate Anesthesia Transfer of Care Note  Patient: Robert Green  Procedure(s) Performed: L2-5 DECOMPRESSION (Back)  Patient Location: PACU  Anesthesia Type:General  Level of Consciousness: drowsy  Airway & Oxygen Therapy: Patient Spontanous Breathing and Patient connected to face mask oxygen  Post-op Assessment: Report given to RN and Post -op Vital signs reviewed and stable  Post vital signs: Reviewed and stable  Last Vitals:  Vitals Value Taken Time  BP 116/62 04/28/22 1345  Temp 36.7 C 04/28/22 1336  Pulse 91 04/28/22 1350  Resp 14 04/28/22 1350  SpO2 97 % 04/28/22 1350  Vitals shown include unvalidated device data.  Last Pain:  Vitals:   04/28/22 1336  TempSrc:   PainSc: Asleep         Complications: No notable events documented.

## 2022-04-28 NOTE — Anesthesia Preprocedure Evaluation (Signed)
Anesthesia Evaluation  Patient identified by MRN, date of birth, ID band  Reviewed: Allergy & Precautions, NPO status , Patient's Chart, lab work & pertinent test results  History of Anesthesia Complications (+) history of anesthetic complications  Airway Mallampati: II       Dental  (+) Dental Advidsory Given, Caps, Teeth Intact   Pulmonary neg shortness of breath, sleep apnea and Continuous Positive Airway Pressure Ventilation , COPD, neg recent URI, former smoker    + decreased breath sounds      Cardiovascular Exercise Tolerance: Good (-) hypertension(-) angina (-) Past MI and (-) Cardiac Stents + dysrhythmias (ablation in 2019) Atrial Fibrillation (-) Valvular Problems/Murmurs Rhythm:Irregular     Neuro/Psych  PSYCHIATRIC DISORDERS Anxiety Depression    negative neurological ROS     GI/Hepatic Neg liver ROS,GERD  ,,  Endo/Other  neg diabetesHypothyroidism  Morbid obesity  Renal/GU negative Renal ROS     Musculoskeletal   Abdominal  (+) + obese  Peds  Hematology negative hematology ROS (+)   Anesthesia Other Findings Past Medical History: 2014: Anemia     Comment:  excessive blood loss during cardiac ablation No date: Anxiety No date: Atrial fibrillation (HCC) No date: Complication of anesthesia     Comment:  wakes up quickly from anesthesia; has awoken too early               before No date: COPD (chronic obstructive pulmonary disease) (HCC) No date: Depression No date: GERD (gastroesophageal reflux disease) No date: Hypertriglyceridemia No date: Hypothyroidism No date: Morbid obesity with BMI of 40.0-44.9, adult (HCC) No date: Obesity No date: Pneumonia No date: RAD (reactive airway disease) No date: Rhinitis, allergic No date: Sleep apnea     Comment:  uses C-Pap No date: Spinal stenosis, lumbar No date: 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   Reproductive/Obstetrics                              Anesthesia Physical Anesthesia Plan  ASA: 3  Anesthesia Plan: General   Post-op Pain Management:    Induction: Intravenous  PONV Risk Score and Plan: 2 and Ondansetron, Dexamethasone, Midazolam and Treatment may vary due to age or medical condition  Airway Management Planned: Oral ETT  Additional Equipment:   Intra-op Plan:   Post-operative Plan: Extubation in OR  Informed Consent: I have reviewed the patients History and Physical, chart, labs and discussed the procedure including the risks, benefits and alternatives for the proposed anesthesia with the patient or authorized representative who has indicated his/her understanding and acceptance.       Plan Discussed with: CRNA  Anesthesia Plan Comments:         Anesthesia Quick Evaluation

## 2022-04-28 NOTE — Discharge Instructions (Signed)
Your surgeon has performed an operation on your lumbar spine (low back) to relieve pressure on one or more nerves. Many times, patients feel better immediately after surgery and can "overdo it." Even if you feel well, it is important that you follow these activity guidelines. If you do not let your back heal properly from the surgery, you can increase the chance of a disc herniation and/or return of your symptoms. The following are instructions to help in your recovery once you have been discharged from the hospital.  * It is ok to take NSAIDs after surgery.  Activity    No bending, lifting, or twisting ("BLT"). Avoid lifting objects heavier than 10 pounds (gallon milk jug).  Where possible, avoid household activities that involve lifting, bending, pushing, or pulling such as laundry, vacuuming, grocery shopping, and childcare. Try to arrange for help from friends and family for these activities while your back heals.  Increase physical activity slowly as tolerated.  Taking short walks is encouraged, but avoid strenuous exercise. Do not jog, run, bicycle, lift weights, or participate in any other exercises unless specifically allowed by your doctor. Avoid prolonged sitting, including car rides.  Talk to your doctor before resuming sexual activity.  You should not drive until cleared by your doctor.  Until released by your doctor, you should not return to work or school.  You should rest at home and let your body heal.   You may shower two days after your surgery.  After showering, lightly dab your incision dry. Do not take a tub bath or go swimming for 3 weeks, or until approved by your doctor at your follow-up appointment.  If you smoke, we strongly recommend that you quit.  Smoking has been proven to interfere with normal healing in your back and will dramatically reduce the success rate of your surgery. Please contact [facility] (800-QUIT-NOW) and use the resources at www.[facility].com for  assistance in stopping smoking.  Surgical Incision   If you have a dressing on your incision, you may remove it three days after your surgery. Keep your incision area clean and dry.  If you have staples or stitches on your incision, you should have a follow up scheduled for removal. If you do not have staples or stitches, you will have steri-strips (small pieces of surgical tape) or Dermabond glue. The steri-strips/glue should begin to peel away within about a week (it is fine if the steri-strips fall off before then). If the strips are still in place one week after your surgery, you may gently remove them.  Diet            You may return to your usual diet. Be sure to stay hydrated.  When to Contact us  Although your surgery and recovery will likely be uneventful, you may have some residual numbness, aches, and pains in your back and/or legs. This is normal and should improve in the next few weeks.  However, should you experience any of the following, contact us immediately: New numbness or weakness Pain that is progressively getting worse, and is not relieved by your pain medications or rest Bleeding, redness, swelling, pain, or drainage from surgical incision Chills or flu-like symptoms Fever greater than 101.0 F (38.3 C) Problems with bowel or bladder functions Difficulty breathing or shortness of breath Warmth, tenderness, or swelling in your calf  Contact Information During office hours (Monday-Friday 9 am to 5 pm), please call your physician at [phone] and ask for Berdine Addison After hours and  weekends, please call [phone] and speak with the neurosurgeon on call For a life-threatening emergency, call 911

## 2022-04-28 NOTE — Op Note (Signed)
Indications: 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 The patient tried and failed conservative management, prompting surgical intervention.  Findings: severe stenosis L2-4, moderate L4/5  Preoperative Diagnosis: Lumbar Stenosis with neurogenic claudication Postoperative Diagnosis: same   EBL: 50 ml IVF: see AR ml Drains: 1 placed Disposition: Extubated and Stable to PACU Complications: none  A foley catheter was placed.   Preoperative Note:   Risks of surgery discussed include: infection, bleeding, stroke, coma, death, paralysis, CSF leak, nerve/spinal cord injury, numbness, tingling, weakness, complex regional pain syndrome, recurrent stenosis and/or disc herniation, vascular injury, development of instability, neck/back pain, need for further surgery, persistent symptoms, development of deformity, and the risks of anesthesia. The patient understood these risks and agreed to proceed.  Operative Note:   1. L2-5 lumbar decompression including central laminectomy and bilateral medial facetectomies including foraminotomies  The patient was then brought from the preoperative center with intravenous access established.  The patient underwent general anesthesia and endotracheal tube intubation, and was then rotated on the [place] rail top where all pressure points were appropriately padded.  The skin was then thoroughly cleansed.  Perioperative antibiotic prophylaxis was administered.  Sterile prep and drapes were then applied and a timeout was then observed.  C-arm was brought into the field under sterile conditions and under lateral visualization the L4-5 interspace was identified and marked.  The incision was marked and injected with local anesthetic. Once this was complete a 8 cm incision was opened with the use of a #10 blade knife.    The metrx tubes were sequentially advanced and confirmed in position at L4-5. An 63m by 825m tube was locked in place to the bed side attachment.  The microscope was then sterilely brought into the field and muscle creep was hemostased with a bipolar and resected with a pituitary rongeur.  A Bovie extender was then used to expose the spinous process and lamina.  Careful attention was placed to not violate the facet capsule. A 3 mm matchstick drill bit was then used to make a hemi-laminotomy trough until the ligamentum flavum was exposed.  This was extended to the base of the spinous process and to the contralateral side to remove all the central bone from each side.  Once this was complete and the underlying ligamentum flavum was visualized, it was dissected with a curette and resected with Kerrison rongeurs.  Extensive ligamentum hypertrophy was noted, requiring a substantial amount of time and care for removal.  The dura was identified and palpated. The kerrison rongeur was then used to remove the medial facet bilaterally until no compression was noted.  A balltip probe was used to confirm decompression of the ipsilateral L5 nerve root.  Additional attention was paid to completion of the contralateral L4-5 foraminotomy until the contralateral traversing nerve root was completely free.  Once this was complete, L4-5 central decompression including medial facetectomy and foraminotomy was confirmed and decompression on both sides was confirmed. No CSF leak was noted.  A Depo-Medrol soaked Gelfoam pledget was placed in the defect.  The wound was copiously irrigated. The tube system was then removed under microscopic visualization and hemostasis was obtained with a bipolar.    After performing the decompression at L4-5, the metrx tubes were sequentially advanced and confirmed in position at L3-4. An 1873my 16m68mbe was locked in place to the bed side attachment.  Fluoroscopy was then removed from the field.  The microscope was then sterilely brought into  the field and muscle creep was hemostased with a  bipolar and resected with a pituitary rongeur.  A Bovie extender was then used to expose the spinous process and lamina.  Careful attention was placed to not violate the facet capsule. A 3 mm matchstick drill bit was then used to make a hemi-laminotomy trough until the ligamentum flavum was exposed.  This was extended to the base of the spinous process and to the contralateral side to remove all the central bone from each side.  Once this was complete and the underlying ligamentum flavum was visualized, it was dissected with a curette and resected with Kerrison rongeurs.  Extensive ligamentum hypertrophy was noted, requiring a substantial amount of time and care for removal.  The dura was identified and palpated. The kerrison rongeur was then used to remove the medial facet bilaterally until no compression was noted.  A balltip probe was used to confirm decompression of the ipsilateral L4 nerve root.  Additional attention was paid to completion of the contralateral foraminotomy until the contralateral L4 nerve root was completely free.  Once this was complete, L3-4 central decompression including medial facetectomy and foraminotomy was confirmed and decompression on both sides was confirmed.   A midline durotomy was encountered.  This was closed with duragen and vistaseal.   The wound was copiously irrigated. The tube system was then removed under microscopic visualization and hemostasis was obtained with a bipolar.    After performing the decompression at L3-4, the metrx tubes were sequentially advanced and confirmed in position at L2-3. An 51m by 752mtube was locked in place to the bed side attachment.  Fluoroscopy was then removed from the field.  The microscope was then sterilely brought into the field and muscle creep was hemostased with a bipolar and resected with a pituitary rongeur.  A Bovie extender was then used to expose the spinous process and lamina.  Careful attention was placed to not  violate the facet capsule. A 3 mm matchstick drill bit was then used to make a hemi-laminotomy trough until the ligamentum flavum was exposed.  This was extended to the base of the spinous process and to the contralateral side to remove all the central bone from each side.  Once this was complete and the underlying ligamentum flavum was visualized, it was dissected with a curette and resected with Kerrison rongeurs.  Extensive ligamentum hypertrophy was noted, requiring a substantial amount of time and care for removal.  The dura was identified and palpated. The kerrison rongeur was then used to remove the medial facet bilaterally until no compression was noted.  A balltip probe was used to confirm decompression of the ipsilateral L3 nerve root.  Additional attention was paid to completion of the contralateral foraminotomy until the contralateral L3 nerve root was completely free.  Once this was complete, L2-3 central decompression including medial facetectomy and foraminotomy was confirmed and decompression on both sides was confirmed. No CSF leak was noted.  A Depo-Medrol soaked Gelfoam pledget was placed in the defect.  The wound was copiously irrigated. The tube system was then removed under microscopic visualization and hemostasis was obtained with a bipolar.     A drain was placed.   The fascial layer was reapproximated with the use of a 0 Vicryl suture.  Subcutaneous tissue layer was reapproximated using 2-0 Vicryl suture.  3-0 monocryl was placed in subcuticular fashion.Nylon was used on the skin. Patient was then rotated back to the preoperative bed awakened from anesthesia and  taken to recovery all counts are correct in this case.  I performed the entire procedure with the assistance of Cooper Render PA as an Pensions consultant. An assistant was required for this procedure due to the complexity.  The assistant provided assistance in tissue manipulation and suction, and was required for the  successful and safe performance of the procedure. I performed the critical portions of the procedure.   Deyana Wnuk K. Izora Ribas MD

## 2022-04-29 ENCOUNTER — Other Ambulatory Visit: Payer: Self-pay

## 2022-04-29 ENCOUNTER — Other Ambulatory Visit: Payer: Self-pay | Admitting: Neurosurgery

## 2022-04-29 ENCOUNTER — Encounter: Payer: Self-pay | Admitting: Neurosurgery

## 2022-04-29 DIAGNOSIS — M48062 Spinal stenosis, lumbar region with neurogenic claudication: Secondary | ICD-10-CM | POA: Diagnosis not present

## 2022-04-29 MED ORDER — CELECOXIB 200 MG PO CAPS: 200.0000 mg | ORAL_CAPSULE | Freq: Two times a day (BID) | ORAL | 0 refills | Status: DC

## 2022-04-29 MED ORDER — METHOCARBAMOL 500 MG PO TABS
500.0000 mg | ORAL_TABLET | Freq: Four times a day (QID) | ORAL | 0 refills | Status: DC | PRN
  Filled 2022-04-29: qty 120, 30d supply, fill #0

## 2022-04-29 MED ORDER — OXYCODONE HCL 5 MG PO TABS
5.0000 mg | ORAL_TABLET | ORAL | 0 refills | Status: AC | PRN
  Filled 2022-04-29: qty 45, 7d supply, fill #0

## 2022-04-29 MED ORDER — SENNA 8.6 MG PO TABS
1.0000 | ORAL_TABLET | Freq: Two times a day (BID) | ORAL | 0 refills | Status: DC | PRN
  Filled 2022-04-29: qty 60, 30d supply, fill #0

## 2022-04-29 NOTE — Progress Notes (Signed)
Reviewed discharge instructions. Pt verbalized understanding. Pt discharged with all personal belongings and a rolling walker. Staff wheeled pt out. Pt transported to home via private vehicle. Marland Kitchen

## 2022-04-29 NOTE — Discharge Summary (Signed)
Discharge Summary  Patient ID: Robert Green MRN: [id number] DOB/AGE: 64-04-1960 62 y.o.  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 His intraoperative course was complicated by a CSF leak which was repaired intraoperatively.  He was admitted for drain output monitoring, therapy evaluation, and pain control.  He was deemed appropriate for discharge home on postop day 1.  His drain output was minimal and his drain was removed on the afternoon of postop day 1.  He denied any headaches or symptoms concerning for persistent CSF leak.  His incision dry.  He was discharged with prescriptions for oxycodone, Robaxin, and senna.  Consults: None  Significant Diagnostic Studies: none  Treatments: surgery: As above.  Please see separately dictated operative report for further details.  Discharge Exam: Blood pressure (!) 108/53, pulse 60, temperature 98.2 F (36.8 C), temperature source Oral, resp. rate 18, height '6\' 4"'$  (1.93 m), weight (!) 162.4 kg, SpO2 100 %. CN II-XII grossly intact 5/5 throughout BLE Incision c/d/i  Disposition: Discharge disposition: 01-Home or Self Care       Discharge Instructions     Incentive spirometry RT   Complete by: As directed       Allergies as of 04/29/2022       Reactions   Chlorhexidine Dermatitis, Itching, Rash, Swelling   Pt develops skin rash   Adhesive [tape] Dermatitis   Tetracyclines & Related    Citalopram Palpitations        Medication List     STOP taking these medications    cyclobenzaprine 10 MG tablet Commonly known as: FLEXERIL   meloxicam 15 MG tablet Commonly known as: MOBIC   traMADol 50 MG  tablet Commonly known as: ULTRAM       TAKE these medications    albuterol 108 (90 Base) MCG/ACT inhaler Commonly known as: VENTOLIN HFA Inhale 2 puffs into the lungs every 6 (six) hours as needed for wheezing or shortness of breath.   cetirizine 10 MG chewable tablet Commonly known as: ZYRTEC Chew 10 mg by mouth at bedtime.   diltiazem 30 MG tablet Commonly known as: CARDIZEM Take 30 mg by mouth every 6 (six) hours as needed.   fluocinonide cream 0.05 % Commonly known as: LIDEX Apply 1 Application topically daily as needed.   Fluticasone-Salmeterol 100-50 MCG/DOSE Aepb Commonly known as: ADVAIR Inhale 1 puff into the lungs daily.   furosemide 20 MG tablet Commonly known as: LASIX Take 20 mg by mouth as needed.   LORazepam 2 MG tablet Commonly known as: ATIVAN Take 2 mg by mouth every 6 (six) hours as needed for anxiety.   methocarbamol 500 MG tablet Commonly known as: ROBAXIN Take 1 tablet (500 mg total) by mouth every 6 (six) hours as needed for muscle spasms.   metroNIDAZOLE 0.75 % gel Commonly known as: METROGEL Apply 1 Application topically 2 (two) times daily.   montelukast 10 MG tablet Commonly known as: SINGULAIR Take 10 mg by mouth at bedtime.   mupirocin ointment 2 % Commonly known as: BACTROBAN Apply 1 Application topically 2 (two) times daily.   nystatin powder Commonly known as: MYCOSTATIN/NYSTOP Apply 1 Application topically 2 (two) times daily.   oxyCODONE 5 MG immediate release tablet Commonly  known as: Oxy IR/ROXICODONE Take 1-2 tablets (5-10 mg total) by mouth every 4 (four) hours as needed for up to 5 days for moderate pain ((score 4 to 6)).   pantoprazole 40 MG tablet Commonly known as: PROTONIX Take 40 mg by mouth daily as needed.   Proctosol HC 2.5 % rectal cream Generic drug: hydrocortisone Proctosol HC 2.5 % topical cream perineal applicator  INSERT INTO THE RECTUM TWO (2) TIMES A DAY AS DIRECTED   senna 8.6 MG Tabs  tablet Commonly known as: SENOKOT Take 1 tablet (8.6 mg total) by mouth 2 (two) times daily as needed for mild constipation.   Synthroid 137 MCG tablet Generic drug: levothyroxine Take 137 mcg by mouth at bedtime.               Durable Medical Equipment  (From admission, onward)           Start     Ordered   04/29/22 1327  For home use only DME Walker rolling  Once       Question Answer Comment  Walker: With 5 Inch Wheels   Patient needs a walker to treat with the following condition S/P lumbar discectomy      04/29/22 1327            Follow-up Information     Geronimo Boot, PA-C Follow up on 05/13/2022.   Specialty: Neurosurgery Contact information: [address] [place] [address] [phone]                 Signed: Loleta Dicker 04/29/2022, 2:49 PM

## 2022-04-29 NOTE — TOC Progression Note (Signed)
Transition of Care [facility]) - Progression Note    Patient Details  Name: Robert Green MRN: [id number] Date of Birth: 06/21/1960  Transition [facility]) CM/SW Roxana, RN Phone Number: 04/29/2022, 3:15 PM  Clinical Narrative:   Patient stable for discharge today.  Orders for Rolling walker. I have asked [employer] to provide patient with rolling walker.   I have spoken with patient and he will be returning home with the support of his wife.  No other TOC needs noted.      Expected Discharge Plan: Home/Self Care Barriers to Discharge: No Barriers Identified  Expected Discharge Plan and Services   Discharge Planning Services: CM Consult   Living arrangements for the past 2 months: Single Family Home Expected Discharge Date: 04/29/22               DME Arranged: Gilford Rile rolling DME Agency: [employer] Date DME Agency Contacted: 04/29/22   Representative spoke with at DME Agency: [place] ([place]) Interventions SDOH Screenings   Food Insecurity: No Food Insecurity (04/28/2022)  Housing: Low Risk  (04/28/2022)  Transportation Needs: No Transportation Needs (04/28/2022)  Utilities: Not At Risk (04/28/2022)  Tobacco Use: Medium Risk (04/29/2022)    Readmission Risk Interventions     No data to display

## 2022-04-29 NOTE — Plan of Care (Signed)

## 2022-04-29 NOTE — Plan of Care (Signed)
  Problem: Education: Goal: Ability to verbalize activity precautions or restrictions will improve Outcome: Progressing   Problem: Activity: Goal: Ability to avoid complications of mobility impairment will improve Outcome: Progressing   Problem: Skin Integrity: Goal: Will show signs of wound healing Outcome: Progressing   Problem: Pain Management: Goal: Pain level will decrease Outcome: Progressing   Problem: Elimination: Goal: Will not experience complications related to bowel motility Outcome: Progressing   Problem: Safety: Goal: Ability to remain free from injury will improve Outcome: Progressing

## 2022-04-29 NOTE — Evaluation (Signed)
Occupational Therapy Evaluation Patient Details Name: Robert Green MRN: [id number] DOB: Dec 27, 1960 Today's Date: 04/29/2022   History of Present Illness 62 y/o male s/p L3-5 decompression 04/28/22.   Clinical Impression   Upon entering the room, pt seated on EOB with wife present in room and agreeable to OT intervention/education. Pt reports living at home independently with wife prior to surgery. Pt's wife is available 24/7 at discharge to assist as needed. OT reviews back precautions in relation to self care tasks in order to maintain precautions. Pt has made several modifications to home and does not appear to be interest in AE for LB self care as wife plans on assisting him while he has precautions. Pt able to maintain precautions while threading underwear onto L foot but needing assistance with R LE. Pt stands with supervision and able to pull clothing over B hips. Pt does endorse increased pain during session and needing mod A to lift B LEs into bed during log roll. Pt reports relief in pain once returning to supine. OT discussed importance of mobility and maintain precautions once returning home. Pt and wife verbalized understanding. Education completed and pt with no further need for skilled acute OT intervention at this time. OT to complete order.      Recommendations for follow up therapy are one component of a multi-disciplinary discharge planning process, led by the attending physician.  Recommendations may be updated based on patient status, additional functional criteria and insurance authorization.   Follow Up Recommendations  No OT follow up     Assistance Recommended at Discharge Intermittent Supervision/Assistance  Patient can return home with the following A little help with walking and/or transfers;A little help with bathing/dressing/bathroom;Help with stairs or ramp for entrance;Assist for transportation;Assistance with cooking/housework       Equipment Recommendations   Other (comment) (recommend LH reacher)       Precautions / Restrictions Precautions Precautions: Back;Fall Restrictions Weight Bearing Restrictions: No      Mobility Bed Mobility Overal bed mobility: Needs Assistance Bed Mobility: Sit to Supine       Sit to supine: Mod assist   General bed mobility comments: mod A for B LEs to log roll back to bed    Transfers Overall transfer level: Needs assistance Equipment used: 1 person hand held assist Transfers: Sit to/from Stand Sit to Stand: Supervision                      ADL either performed or assessed with clinical judgement   ADL Overall ADL's : Needs assistance/impaired                     Lower Body Dressing: Minimal assistance;Sit to/from stand Lower Body Dressing Details (indicate cue type and reason): to thread R LE into clothing and min guard for balance while pulling over B hips                     Vision Patient Visual Report: No change from baseline              Pertinent Vitals/Pain Pain Assessment Pain Assessment: 0-10 Pain Score: 6  Pain Location: back Pain Descriptors / Indicators: Discomfort Pain Intervention(s): Premedicated before session, Repositioned, Monitored during session     Hand Dominance Right   Extremity/Trunk Assessment Upper Extremity Assessment Upper Extremity Assessment: Generalized weakness;Overall [facility] for tasks assessed   Lower Extremity Assessment Lower Extremity Assessment: Generalized weakness;Overall [facility] for tasks assessed  Communication Communication Communication: No difficulties   Cognition Arousal/Alertness: Awake/alert Behavior During Therapy: WFL for tasks assessed/performed Overall Cognitive Status: Within Functional Limits for tasks assessed                                       General Comments  Pt was able to move with relative ease once up, expected slow and guarded gait with deminishing UE reliance on the  walker with increased time/comfort in standing            Home Living Family/patient expects to be discharged to:: Private residence Living Arrangements: Spouse/significant other Available Help at Discharge: Available 24 hours/day Type of Home: House Home Access: Stairs to enter [employer] of Steps: 3 Entrance Stairs-Rails: None Home Layout: One level     Bathroom Shower/Tub: Occupational psychologist: Handicapped height     Home Equipment: [place] held shower head          Prior Functioning/Environment Prior Level of Function : Independent/Modified Independent             Mobility Comments: Recent difficulty walking more than a few minutes 2/2 back/LE symptoms but working, driving, independent otherwise ADLs Comments: Ind in self care and IADLs                 OT Goals(Current goals can be found in the care plan section) Acute Rehab OT Goals Patient Stated Goal: to go home OT Goal Formulation: With patient/family Time For Goal Achievement: 04/29/22 Potential to Achieve Goals: Fair  OT Frequency:         AM-PAC OT "6 Clicks" Daily Activity     Outcome Measure Help from another person eating meals?: None Help from another person taking care of personal grooming?: None Help from another person toileting, which includes using toliet, bedpan, or urinal?: A Little Help from another person bathing (including washing, rinsing, drying)?: A Little Help from another person to put on and taking off regular upper body clothing?: None Help from another person to put on and taking off regular lower body clothing?: A Little 6 Click Score: 21   End of Session Nurse Communication: Mobility status  Activity Tolerance: Patient tolerated treatment well Patient left: in bed;with call bell/phone within reach;with family/visitor present                   Time: [id number] OT Time Calculation (min): 18 min Charges:  OT General Charges $OT  Visit: 1 Visit OT Evaluation $OT Eval Low Complexity: 1 Low OT Treatments $Self Care/Home Management : 8-22 mins  Darleen Crocker, MS, OTR/L , CBIS ascom [phone]  04/29/22, 11:52 AM

## 2022-04-29 NOTE — Evaluation (Signed)
Physical Therapy Evaluation Patient Details Name: Robert Green MRN: [id number] DOB: January 25, 1961 Today's Date: 04/29/2022  History of Present Illness  62 y/o male s/p L3-5 decompression 04/28/22.  Clinical Impression  Pt showed good effort and motivation t/o PT eval and treat.  He was able to get himself to sitting EOB with heavy UE use on rails but did not need physical assist.  Pt showed good understanding of and respect for neutral spinal alignment and generally did not need a lot of cuing after initial education in this regard.  Pt was able to rise from relatively low bed height w/o assist, showed appropriate UE/AD reliance and was safe and appropriate with prolonged bout of ambulation and stair negotiation.  Will benefit from continued PT per post-op protocol.     Recommendations for follow up therapy are one component of a multi-disciplinary discharge planning process, led by the attending physician.  Recommendations may be updated based on patient status, additional functional criteria and insurance authorization.  Follow Up Recommendations Follow physician's recommendations for discharge plan and follow up therapies      Assistance Recommended at Discharge Intermittent Supervision/Assistance  Patient can return home with the following  Help with stairs or ramp for entrance;Assist for transportation;Assistance with cooking/housework;A little help with bathing/dressing/bathroom    Equipment Recommendations Rolling walker (2 wheels)  Recommendations for Other Services       Functional Status Assessment Patient has had a recent decline in their functional status and demonstrates the ability to make significant improvements in function in a reasonable and predictable amount of time.     Precautions / Restrictions Precautions Precautions: Back;Fall Restrictions Weight Bearing Restrictions: No      Mobility  Bed Mobility Overal bed mobility: Modified Independent              General bed mobility comments: Pt able to maintain neutral spine and using rails was able to get himself to sitting EOB w/o phyiscal assist    Transfers Overall transfer level: Modified independent Equipment used: Rolling walker (2 wheels)                    Ambulation/Gait Ambulation/Gait assistance: Min guard Gait Distance (Feet): 350 Feet Assistive device: Rolling walker (2 wheels), None         General Gait Details: Pt with expected initial hesitation but able to increase cadence and decrease UE/AD use with increased distance.  No LOBs, buckling, inconsistency  Stairs Stairs: Yes Stairs assistance: Supervision Stair Management: No rails, Backwards, With walker Number of Stairs: 4 General stair comments: Pt was able to negotiate up/down steps safely and w/o phyiscal assist apart from normal assist with AD management  Wheelchair Mobility    Modified Rankin (Stroke Patients Only)       Balance Overall balance assessment: Modified Independent                                           Pertinent Vitals/Pain Pain Assessment Pain Assessment: 0-10 Pain Score: 6  Pain Intervention(s): RN gave pain meds during session, Limited activity within patient's tolerance    Home Living Family/patient expects to be discharged to:: Private residence Living Arrangements: Spouse/significant other Available Help at Discharge: Available 24 hours/day   Home Access: Stairs to enter Entrance Stairs-Rails: None Entrance Stairs-Number of Steps: 3   Home Layout: One level  Prior Function Prior Level of Function : Independent/Modified Independent             Mobility Comments: Recent difficulty walking more than a few minutes 2/2 back/LE symptoms but working, driving, independent otherwise       Hand Dominance        Extremity/Trunk Assessment   Upper Extremity Assessment Upper Extremity Assessment: Generalized weakness;Overall [facility] for  tasks assessed    Lower Extremity Assessment Lower Extremity Assessment: Generalized weakness;Overall WFL for tasks assessed       Communication   Communication: No difficulties  Cognition Arousal/Alertness: Awake/alert Behavior During Therapy: WFL for tasks assessed/performed Overall Cognitive Status: Within Functional Limits for tasks assessed                                          General Comments General comments (skin integrity, edema, etc.): Pt was able to move with relative ease once up, expected slow and guarded gait with deminishing UE reliance on the walker with increased time/comfort in standing    Exercises     Assessment/Plan    PT Assessment Patient needs continued PT services  PT Problem List Decreased strength;Decreased range of motion;Decreased activity tolerance;Decreased balance;Decreased mobility;Decreased knowledge of use of DME;Decreased safety awareness;Pain       PT Treatment Interventions DME instruction;Gait training;Stair training;Functional mobility training;Therapeutic activities;Therapeutic exercise;Balance training;Neuromuscular re-education;Patient/family education    PT Goals (Current goals can be found in the Care Plan section)  Acute Rehab PT Goals Patient Stated Goal: get back to activity PT Goal Formulation: With patient Time For Goal Achievement: 05/12/22 Potential to Achieve Goals: Good    Frequency 7X/week     Co-evaluation               AM-PAC PT "6 Clicks" Mobility  Outcome Measure Help needed turning from your back to your side while in a flat bed without using bedrails?: A Little Help needed moving from lying on your back to sitting on the side of a flat bed without using bedrails?: A Little Help needed moving to and from a bed to a chair (including a wheelchair)?: A Little Help needed standing up from a chair using your arms (e.g., wheelchair or bedside chair)?: A Little Help needed to walk in  hospital room?: A Little Help needed climbing 3-5 steps with a railing? : A Little 6 Click Score: 18    End of Session Equipment Utilized During Treatment: Gait belt Activity Tolerance: Patient tolerated treatment well Patient left: with chair alarm set;with call bell/phone within reach;with family/visitor present Nurse Communication: Mobility status PT Visit Diagnosis: Muscle weakness (generalized) (M62.81);Difficulty in walking, not elsewhere classified (R26.2);Pain Pain - part of body:  (lumbago)    Time: [id number] PT Time Calculation (min) (ACUTE ONLY): 42 min   Charges:   PT Evaluation $PT Eval Low Complexity: 1 Low PT Treatments $Gait Training: 8-22 mins        Kreg Shropshire, DPT 04/29/2022, 11:16 AM

## 2022-04-29 NOTE — Progress Notes (Addendum)
Progress Note  History: Robert Green is ha 62 y.o s/p L2-5 lumbar decompression   POD1: Pt reporting right chest wall tenderness overnight and numbness in his 1st and 5th digits of his right hand.   Physical Exam: Vitals:   04/28/22 2359 04/29/22 0448  BP: 118/62 102/61  Pulse: 64 (!) 51  Resp: 17   Temp: 98 F (36.7 C)   SpO2: 95% 95%    AA Ox3 CNI  Strength:5/5 throughout BLE HV 40 since surgery  Data:  Other tests/results: none   Assessment/Plan:  Robert Green is a 62 y.o presenting with lumbar stenosis and neurogenic claudication s/p decompression. He reports overall improvement of his leg pain  - mobilize - pain control - DVT prophylaxis - PTOT - will keep HV for now and reevaluate this afternoon  Cooper Render PA-C Department of Neurosurgery

## 2022-04-30 NOTE — Anesthesia Postprocedure Evaluation (Signed)
Anesthesia Post Note  Patient: Robert Green  Procedure(s) Performed: L2-5 DECOMPRESSION (Back) REPAIR OF CEREBROSPINAL FLUID LEAK  Patient location during evaluation: PACU Anesthesia Type: General Level of consciousness: awake and alert Vital Signs Assessment: post-procedure vital signs reviewed and stable Respiratory status: nonlabored ventilation Cardiovascular status: stable Anesthetic complications: no   No notable events documented.   Last Vitals:  Vitals:   04/29/22 0448 04/29/22 0848  BP: 102/61 (!) 108/53  Pulse: (!) 51 60  Resp:  18  Temp:  36.8 C  SpO2: 95% 100%    Last Pain:  Vitals:   04/29/22 1320  TempSrc:   PainSc: 4                  VAN STAVEREN,Twisha Vanpelt

## 2022-05-01 ENCOUNTER — Other Ambulatory Visit: Payer: Self-pay | Admitting: Neurosurgery

## 2022-05-01 MED ORDER — HYDROXYZINE HCL 10 MG PO TABS: 10.0000 mg | ORAL_TABLET | Freq: Three times a day (TID) | ORAL | 0 refills | Status: AC | PRN

## 2022-05-09 ENCOUNTER — Other Ambulatory Visit: Payer: Self-pay | Admitting: Neurosurgery

## 2022-05-09 MED ORDER — OXYCODONE HCL 5 MG PO TABS: 5.0000 mg | ORAL_TABLET | Freq: Four times a day (QID) | ORAL | 0 refills | Status: AC | PRN

## 2022-05-09 NOTE — Progress Notes (Signed)
Patient contacted the office requesting refill of his pain medication.  The PDMP was checked and appropriate.  A refill of his oxycodone was sent to his pharmacy on file.

## 2022-05-12 NOTE — Progress Notes (Unsigned)
   REFERRING PHYSICIAN:  Noel Journey, Md [address] [place],   [address]  DOS: 04/28/22 L2-L5 lumbar decompression including central laminectomy and bilateral medial facetectomies including foraminotomies   HISTORY OF PRESENT ILLNESS: Robert Green is approximately 2 weeks status post L2-L5 lumbar decompression including central laminectomy and bilateral medial facetectomies including foraminotomies that was complicated by CSF leak. This was repaired intraoperatively. Was given oxycodone and robaxin on discharge from the hospital.   His preop back pain and right leg pain (anterior thigh and lateral thigh/calf) are mostly gone. Numbness in both legs is much better, but he still has tingling in both feet. No headaches.     He is having new complaint of right posterior leg pain that occasionally goes to foot. He has numbness from the bottom of his incision into his testicles.   No bowel or bladder incontinence. He has to work to strain/push out BM. He doesn't feel like he is emptying his bladder. He goes and then has go again in 5 minutes.   He is taking prn oxycodone, usually 1-2 at night. He is taking prn robaxin and celebrex during the day.   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 Wants to go back to limited schedule next week.    PHYSICAL EXAMINATION:  General: Patient is well developed, well nourished, calm, collected, and in no apparent distress.   NEUROLOGICAL:  General: In no acute distress.   Awake, alert, oriented to person, place, and time.  Pupils equal round and reactive to light.  Facial tone is symmetric.     Strength:            Side Iliopsoas Quads Hamstring PF DF EHL  R 5 5 5 5 5 5   L 5 5 5 5 5 5    Incision clean and dry, sutures have been removed.  Sensation is grossly intact to bilateral legs, but he has some subjective numbness in right lateral thigh and right medial ankle compared to left.    ROS (Neurologic):  Negative except as noted  above  IMAGING: Nothing new to review.   ASSESSMENT/PLAN:  Robert Green is doing well s/p above surgery. Treatment options reviewed with patient and following plan made with Dr. Izora Ribas:   - Sutures removed today without complication. Wound care reviewed.  - I have advised the patient to lift up to 10 pounds until 6 weeks after surgery (follow up with Dr. Izora Ribas).  - No bending, twisting, or lifting.  - Continue on current medications including prn oxycodone and celebrex. - Stop robaxin. If this does not help with urinary symptoms, then let me know and we can refer him to urology.  - Note to return to work at home only starting 05/19/22. No driving to [facility]. See note for details.  - Follow up as scheduled in 4 weeks and prn.   Advised to contact the office if any questions or concerns arise.  Geronimo Boot PA-C Department of neurosurgery

## 2022-05-13 ENCOUNTER — Encounter: Payer: Self-pay | Admitting: Orthopedic Surgery

## 2022-05-13 ENCOUNTER — Ambulatory Visit (INDEPENDENT_AMBULATORY_CARE_PROVIDER_SITE_OTHER): Payer: BC Managed Care – PPO | Admitting: Orthopedic Surgery

## 2022-05-13 VITALS — BP 134/78 | Temp 97.6°F | Ht 76.0 in | Wt 358.0 lb

## 2022-05-13 DIAGNOSIS — M48061 Spinal stenosis, lumbar region without neurogenic claudication: Secondary | ICD-10-CM

## 2022-05-13 DIAGNOSIS — Z9889 Other specified postprocedural states: Secondary | ICD-10-CM

## 2022-05-13 NOTE — Addendum Note (Signed)
Addended byGeronimo Boot on: 05/13/2022 04:06 PM   Modules accepted: Orders

## 2022-05-15 DIAGNOSIS — Z9889 Other specified postprocedural states: Secondary | ICD-10-CM

## 2022-05-15 DIAGNOSIS — M48061 Spinal stenosis, lumbar region without neurogenic claudication: Secondary | ICD-10-CM

## 2022-05-15 MED ORDER — METHYLPREDNISOLONE 4 MG PO TBPK: ORAL_TABLET | ORAL | 0 refills | Status: DC

## 2022-05-15 NOTE — Addendum Note (Signed)
Addended byGeronimo Boot on: 05/15/2022 03:03 PM   Modules accepted: Orders

## 2022-05-20 ENCOUNTER — Telehealth: Payer: BC Managed Care – PPO | Admitting: Neurosurgery

## 2022-05-27 ENCOUNTER — Other Ambulatory Visit: Payer: Self-pay | Admitting: Neurosurgery

## 2022-06-10 ENCOUNTER — Ambulatory Visit (INDEPENDENT_AMBULATORY_CARE_PROVIDER_SITE_OTHER): Payer: BC Managed Care – PPO | Admitting: Neurosurgery

## 2022-06-10 ENCOUNTER — Encounter: Payer: Self-pay | Admitting: Neurosurgery

## 2022-06-10 VITALS — BP 124/78 | Temp 97.9°F | Ht 76.0 in | Wt 359.6 lb

## 2022-06-10 DIAGNOSIS — Z09 Encounter for follow-up examination after completed treatment for conditions other than malignant neoplasm: Secondary | ICD-10-CM

## 2022-06-10 DIAGNOSIS — M48061 Spinal stenosis, lumbar region without neurogenic claudication: Secondary | ICD-10-CM

## 2022-06-10 NOTE — Progress Notes (Signed)
   REFERRING PHYSICIAN:  [name], Md [address] [place],  [place] [address]  DOS: 04/28/22 L2-L5 lumbar decompression including central laminectomy and bilateral medial facetectomies including foraminotomies   HISTORY OF PRESENT ILLNESS: Robert Green is status post L2-L5 lumbar decompression including central laminectomy and bilateral medial facetectomies including foraminotomies.  He is doing well from surgery.  He has some discomfort with long car rides.  PHYSICAL EXAMINATION:  General: Patient is well developed, well nourished, calm, collected, and in no apparent distress.   NEUROLOGICAL:  General: In no acute distress.   Awake, alert, oriented to person, place, and time.  Pupils equal round and reactive to light.  Facial tone is symmetric.     Strength:            Side Iliopsoas Quads Hamstring PF DF EHL  R L Incision clean and dry, sutures have been removed.  Sensation is grossly intact to bilateral legs, but he has some subjective numbness.,   ROS (Neurologic):  Negative except as noted above  IMAGING: Nothing new to review.   ASSESSMENT/PLAN:  Robert Green is doing well s/p above surgery.  Reviewed activity limitations.  He will start slowly increasing his activity.  Will see him back in clinic in approximately 6 weeks.  If he is doing well, he may change to a telephone visit.  I have cleared him to return to work in person as soon as he can tolerate the drive.  He will see a urologist soon for some urinary issues which have been ongoing since prior to surgery.  Venetia Night MD Department of neurosurgery

## 2022-06-10 NOTE — Telephone Encounter (Signed)
Spoke with patient. Will do note to return to work full time starting 06/11/22. He is able to drive to [facility] once a week. For every hour of sitting, he will need to be able to stand up and walk around as needed.   Note done.

## 2022-06-17 ENCOUNTER — Encounter: Payer: BC Managed Care – PPO | Admitting: Neurosurgery

## 2022-07-11 NOTE — Progress Notes (Unsigned)
   Telephone Visit- Progress Note: Referring Physician:  [name], MD [address] [place],  [place] [address]  Primary Physician:  [name], MD  This visit was performed via telephone.  Patient location: home Provider location: office  I spent a total of 10 minutes non-face-to-face activities for this visit on the date of this encounter including review of current clinical condition and response to treatment.    Patient has given verbal consent to this telephone visits and we reviewed the limitations of a telephone visit. Patient wishes to proceed.    Chief Complaint:  postop recheck  History of Present Illness: DOS: 04/28/22 L2-L5 lumbar decompression including central laminectomy and bilateral medial facetectomies including foraminotomies   He was doing well at his last visit- he was cleared to go back to work as soon as he could tolerate the drive. He was to follow up with urology for some urinary issues that started prior to surgery.   Phone visit scheduled to check on his progress.  He is doing very well. He is back at work. He has minimal back soreness and aching when he is in the car a lot (has to drive far for work). He has relief with motrin and heat. He has no pain the next day. He has intermittent pulling in his back. No leg pain.   He was diagnosed with cystitis and on antibiotics. No urinary issues since that time, but he is making a f/u with urology.   Exam: No exam done as this was a telephone encounter.     Imaging: none    Assessment and Plan: Robert Green is doing well s/p above surgery. Treatment options reviewed with patient and following plan made:   - Continue with current activity.  - He is not taking celebrex. Can continue on prn OTC aleve as directed on bottle. Take with food.  - Phone visit follow up in 3 months and then can likely follow up prn.    Advised to contact the office if any questions or concerns arise.    [name] PA-C Department of neurosurgery

## 2022-07-17 ENCOUNTER — Ambulatory Visit (INDEPENDENT_AMBULATORY_CARE_PROVIDER_SITE_OTHER): Payer: BC Managed Care – PPO | Admitting: Orthopedic Surgery

## 2022-07-17 ENCOUNTER — Encounter: Payer: Self-pay | Admitting: Orthopedic Surgery

## 2022-07-17 DIAGNOSIS — Z9889 Other specified postprocedural states: Secondary | ICD-10-CM

## 2022-07-17 DIAGNOSIS — M48061 Spinal stenosis, lumbar region without neurogenic claudication: Secondary | ICD-10-CM

## 2022-07-17 DIAGNOSIS — Z09 Encounter for follow-up examination after completed treatment for conditions other than malignant neoplasm: Secondary | ICD-10-CM

## 2022-07-24 ENCOUNTER — Telehealth: Payer: BC Managed Care – PPO | Admitting: Neurosurgery

## 2022-08-14 DIAGNOSIS — Z9889 Other specified postprocedural states: Secondary | ICD-10-CM

## 2022-08-15 MED ORDER — CELECOXIB 200 MG PO CAPS: 200.0000 mg | ORAL_CAPSULE | Freq: Every day | ORAL | 1 refills | Status: DC

## 2022-08-15 MED ORDER — METHYLPREDNISOLONE 4 MG PO TBPK: ORAL_TABLET | ORAL | 0 refills | Status: DC

## 2022-08-15 NOTE — Telephone Encounter (Signed)
Will do medrol dose pack to help with pain. He can restart celebrex after dose pack.

## 2022-08-15 NOTE — Addendum Note (Signed)
Addended byDrake Leach on: 08/15/2022 12:48 PM   Modules accepted: Orders

## 2022-09-08 NOTE — Telephone Encounter (Signed)
Please schedule him a f/u with me or Danielle for evaluation of back pain.

## 2022-09-09 NOTE — Telephone Encounter (Signed)
Robert Green had the soonest open slot for 7/23 at 2:30pm, he confirmed.

## 2022-09-16 ENCOUNTER — Encounter: Payer: Self-pay | Admitting: Neurosurgery

## 2022-09-16 ENCOUNTER — Ambulatory Visit
Admission: RE | Admit: 2022-09-16 | Discharge: 2022-09-16 | Disposition: A | Discharge status: Home / Self Care | Payer: BC Managed Care – PPO | Source: Ambulatory Visit | Attending: Neurosurgery | Admitting: Neurosurgery

## 2022-09-16 ENCOUNTER — Ambulatory Visit (INDEPENDENT_AMBULATORY_CARE_PROVIDER_SITE_OTHER): Payer: BC Managed Care – PPO | Admitting: Neurosurgery

## 2022-09-16 ENCOUNTER — Ambulatory Visit
Admission: RE | Admit: 2022-09-16 | Discharge: 2022-09-16 | Disposition: A | Discharge status: Home / Self Care | Payer: BC Managed Care – PPO | Attending: Neurosurgery | Admitting: Neurosurgery

## 2022-09-16 VITALS — BP 122/70 | Ht 76.0 in | Wt 359.6 lb

## 2022-09-16 DIAGNOSIS — Z9889 Other specified postprocedural states: Secondary | ICD-10-CM

## 2022-09-16 DIAGNOSIS — M545 Low back pain, unspecified: Secondary | ICD-10-CM

## 2022-09-16 DIAGNOSIS — Z09 Encounter for follow-up examination after completed treatment for conditions other than malignant neoplasm: Secondary | ICD-10-CM

## 2022-09-16 NOTE — Progress Notes (Signed)
Follow-up note: Referring Physician:  No referring provider defined for this encounter.  Primary Physician:  [name], MD  Chief Complaint:  back pain  DOS: 04/28/22 L2-L5 lumbar decompression including central laminectomy and bilateral medial facetectomies including foraminotomies   History of Present Illness: Robert Green is a 62 y.o. male who presents with the chief complaint of about 2 months of sharp left sided low back pain.  He states that this feels like the pain he had in his left side chronically and is slightly worse.  It starts in the mid to low back in the midline and radiates to the left without involvement into the buttock or down the leg.  He states that it is worse with activity and improved some with rest.  He did undergo treatment with a Medrol Dosepak which did help for a couple of weeks however his symptoms flared back up about 1 to 2 weeks ago.  He is currently taking diclofenac and Flexeril which provides some relief.  07/17/22 Robert Green He was doing well at his last visit- he was cleared to go back to work as soon as he could tolerate the drive. He was to follow up with urology for some urinary issues that started prior to surgery.    Phone visit scheduled to check on his progress.   He is doing very well. He is back at work. He has minimal back soreness and aching when he is in the car a lot (has to drive far for work). He has relief with motrin and heat. He has no pain the next day. He has intermittent pulling in his back. No leg pain.    He was diagnosed with cystitis and on antibiotics. No urinary issues since that time, but he is making a f/u with urology.   Review of Systems:  A 10 point review of systems is negative, and the pertinent positives and negatives detailed in the HPI.  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     Past Surgical History: Past Surgical History:  Procedure Laterality Date   CARDIAC CATHETERIZATION  2014   CARDIAC CATHETERIZATION  2010   CARDIAC ELECTROPHYSIOLOGY STUDY AND ABLATION  2018   CARDIAC ELECTROPHYSIOLOGY STUDY AND ABLATION  2014   COLON FISTULA REPAIR  2016   COLONOSCOPY WITH PROPOFOL N/A 02/11/2016   Procedure: COLONOSCOPY WITH PROPOFOL;  Surgeon: Christena Deem, MD;  Location[facility] ENDOSCOPY;  Service: Endoscopy;  Laterality: N/A;   EXCISIONAL HEMORRHOIDECTOMY  2015   EYE SURGERY Left 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   KNEE ARTHROSCOPY Left 2012   KNEE ARTHROSCOPY Right 2014   LUMBAR LAMINECTOMY/DECOMPRESSION MICRODISCECTOMY N/A 04/28/2022   Procedure: L2-5 DECOMPRESSION;  Surgeon: Venetia Night, MD;  Location: [facility] ORS;  Service: Neurosurgery;  Laterality: N/A;   REPAIR OF CEREBROSPINAL FLUID LEAK  04/28/2022   Procedure: REPAIR OF CEREBROSPINAL FLUID LEAK;  Surgeon: Venetia Night, MD;  Location: [facility] ORS;  Service: Neurosurgery;;   RHINOPLASTY  2010   SHOULDER ARTHROSCOPY Right 2017    Allergies: Allergies as of 09/16/2022 - Review Complete 09/16/2022  Allergen Reaction Noted   Chlorhexidine Dermatitis, Itching, Rash, and Swelling 11/24/2016   Adhesive [tape] Dermatitis 02/08/2016   Tetracyclines & related     Citalopram Palpitations 02/08/2016    Medications: Outpatient Encounter Medications as of 09/16/2022  Medication Sig   albuterol (PROVENTIL HFA;VENTOLIN HFA) 108  (90 Base) MCG/ACT inhaler Inhale 2 puffs into the lungs every 6 (six) hours as needed for wheezing or shortness of breath.   celecoxib (CELEBREX) 200 MG capsule Take 1 capsule (200 mg total) by mouth daily. Take with food.   cetirizine (ZYRTEC) 10 MG chewable tablet Chew 10 mg by mouth at bedtime.   diltiazem (CARDIZEM) 30 MG tablet Take 30 mg by mouth every 6 (six) hours as needed.   fluocinonide cream (LIDEX) 0.05 % Apply 1 Application topically daily as needed.   Fluticasone-Salmeterol (ADVAIR) 100-50 MCG/DOSE AEPB Inhale 1 puff into the lungs daily.    furosemide (LASIX) 20 MG tablet Take 20 mg by mouth as needed.    hydrocortisone (PROCTOSOL HC) 2.5 % rectal cream Proctosol HC 2.5 % topical cream perineal applicator  INSERT INTO THE RECTUM TWO (2) TIMES A DAY AS DIRECTED   LORazepam (ATIVAN) 2 MG tablet Take 2 mg by mouth every 6 (six) hours as needed for anxiety.   methylPREDNISolone (MEDROL DOSEPAK) 4 MG TBPK tablet Use as directed x 6 days. Do not take celebrex while on this medication. Can restart Celebrex after you finish dose pack.   metroNIDAZOLE (METROGEL) 0.75 % gel Apply 1 Application topically 2 (two) times daily.   montelukast (SINGULAIR) 10 MG tablet Take 10 mg by mouth at bedtime.   mupirocin ointment (BACTROBAN) 2 % Apply 1 Application topically 2 (two) times daily.   nystatin (MYCOSTATIN/NYSTOP) powder Apply 1 Application topically 2 (two) times daily.   pantoprazole (PROTONIX) 40 MG tablet Take 40 mg by mouth daily as needed.   senna (SENOKOT) 8.6 MG TABS tablet Take 1 tablet (8.6 mg total) by mouth 2 (two) times daily as needed for mild constipation.   SYNTHROID 137 MCG tablet Take 137 mcg by mouth at bedtime.   No facility-administered encounter medications on file as of 09/16/2022.    Social History: Social History   Tobacco Use   Smoking status: Former   Smokeless tobacco: Former    Types: Chew    Quit date: 03/04/2022  Vaping Use   Vaping status: Never Used   Substance Use Topics   Alcohol use: Yes    Alcohol/week: 1.0 standard drink of alcohol    Types: 1 Cans of beer per week    Comment: occassional   Drug use: No    Family Medical History: Family History  Problem Relation Age of Onset   Cancer Father    Diabetes Sister    Diabetes Brother     Exam: Today's Vitals   09/16/22 1425  BP: 122/70  Weight: (!) 163.1 kg  Height: 6\' 4"  (1.93 m)  PainSc: 3   PainLoc: Back   Body mass index is 43.77 kg/m.  Exam: 5/5 strength throughout BLE   Imaging: No interval imaging to review   Assessment and Plan: Mr. Homan is a pleasant 62 y.o. male  with worsening left-sided low back pain.  We discussed his differential diagnosis which include facet arthropathy, recurrent lumbar radiculopathy, or SI joint dysfunction.  He does not seem to localize his symptoms well to his SI joint and therefore makes this diagnosis less likely.  I would like to obtain dynamic x-rays as well as updated lumbar MRI.  He will need a large bore MRI and would like to do this close [place].  He will continue to take diclofenac and is going to try Robaxin instead of Flexeril.  I will set him up for a telephone visit after completion of his x-rays and MRI to discussion the results and further plan of care.  He was encouraged to call the office in the interim should he have any questions or concerns.  He expressed understanding and was in agreement with this plan.  I spent a total of 30 minutes in both face-to-face and non-face-to-face activities for this visit on the date of this encounter including review of records, review of symptoms, discussion of differential diagnosis, discussion of plan of care, physical exam, and order placement.  Manning Charity PA-C Neurosurgery

## 2022-10-09 ENCOUNTER — Ambulatory Visit (INDEPENDENT_AMBULATORY_CARE_PROVIDER_SITE_OTHER): Payer: BC Managed Care – PPO | Admitting: Orthopedic Surgery

## 2022-10-09 DIAGNOSIS — Z9889 Other specified postprocedural states: Secondary | ICD-10-CM

## 2022-10-09 DIAGNOSIS — M545 Low back pain, unspecified: Secondary | ICD-10-CM

## 2022-10-09 NOTE — Progress Notes (Signed)
Spoke with patient. Phone visit scheduled with me back in May. He has since since [place] and had new imaging ordered.   His MRI is scheduled for 10/25/22 at [facility].   Will schedule him for phone follow up with Duwayne Heck a few days after this.   No charge done.

## 2022-10-30 ENCOUNTER — Other Ambulatory Visit: Payer: Self-pay

## 2022-10-30 ENCOUNTER — Inpatient Hospital Stay
Admission: RE | Admit: 2022-10-30 | Discharge: 2022-10-30 | Disposition: A | Discharge status: Home / Self Care | Payer: Self-pay | Source: Ambulatory Visit | Attending: Neurosurgery | Admitting: Neurosurgery

## 2022-10-30 DIAGNOSIS — Z049 Encounter for examination and observation for unspecified reason: Secondary | ICD-10-CM

## 2022-11-04 ENCOUNTER — Ambulatory Visit (INDEPENDENT_AMBULATORY_CARE_PROVIDER_SITE_OTHER): Payer: BC Managed Care – PPO | Admitting: Neurosurgery

## 2022-11-04 DIAGNOSIS — Z9889 Other specified postprocedural states: Secondary | ICD-10-CM

## 2022-11-04 DIAGNOSIS — M48062 Spinal stenosis, lumbar region with neurogenic claudication: Secondary | ICD-10-CM

## 2022-11-04 MED ORDER — CELECOXIB 200 MG PO CAPS: 200.0000 mg | ORAL_CAPSULE | Freq: Two times a day (BID) | ORAL | 0 refills | Status: DC

## 2022-11-04 NOTE — Progress Notes (Signed)
Neurosurgery Telephone (Audio-Only) Note  Requesting Provider     No referring provider defined for this encounter. T: N/A F:   Primary Care Provider [name], MD [address] [place] [address] T: [phone] F: [phone]  Telehealth visit was conducted with Robert Green, a 62 y.o. male via telephone.  History of Present Illness: Robert Green is a 62 year old presenting today via telephone visit for review of his MRI.  Overall his symptoms are unchanged from his last visit.  LOV 09/16/22 Robert Green is a 62 y.o. male who presents with the chief complaint of about 2 months of sharp left sided low back pain.  He states that this feels like the pain he had in his left side chronically and is slightly worse.  It starts in the mid to low back in the midline and radiates to the left without involvement into the buttock or down the leg.  He states that it is worse with activity and improved some with rest.  He did undergo treatment with a Medrol Dosepak which did help for a couple of weeks however his symptoms flared back up about 1 to 2 weeks ago.  He is currently taking diclofenac and Flexeril which provides some relief.   07/17/22 Robert Green He was doing well at his last visit- he was cleared to go back to work as soon as he could tolerate the drive. He was to follow up with urology for some urinary issues that started prior to surgery.    Phone visit scheduled to check on his progress.   He is doing very well. He is back at work. He has minimal back soreness and aching when he is in the car a lot (has to drive far for work). He has relief with motrin and heat. He has no pain the next day. He has intermittent pulling in his back. No leg pain.    He was diagnosed with cystitis and on antibiotics. No urinary issues since that time, but he is making a f/u with urology.   General Review of Systems:  A ROS was performed including pertinent positive and negatives  as documented.  All other systems are negative.    Prior to Admission medications   Medication Sig Start Date End Date Taking? Authorizing Provider  albuterol (PROVENTIL HFA;VENTOLIN HFA) 108 (90 Base) MCG/ACT inhaler Inhale 2 puffs into the lungs every 6 (six) hours as needed for wheezing or shortness of breath.    [provider]  celecoxib (CELEBREX) 200 MG capsule Take 1 capsule (200 mg total) by mouth daily. Take with food. 08/15/22   Robert Leach, PA-C  cetirizine (ZYRTEC) 10 MG chewable tablet Chew 10 mg by mouth at bedtime.    [provider]  diltiazem (CARDIZEM) 30 MG tablet Take 30 mg by mouth every 6 (six) hours as needed.    [provider]  fluocinonide cream (LIDEX) 0.05 % Apply 1 Application topically daily as needed.    [provider]  Fluticasone-Salmeterol (ADVAIR) 100-50 MCG/DOSE AEPB Inhale 1 puff into the lungs daily.     [provider]  furosemide (LASIX) 20 MG tablet Take 20 mg by mouth as needed.     [provider]  hydrocortisone (PROCTOSOL HC) 2.5 % rectal cream Proctosol HC 2.5 % topical cream perineal applicator  INSERT INTO THE RECTUM TWO (2) TIMES A DAY AS DIRECTED 02/09/17   [provider]  LORazepam (ATIVAN) 2 MG tablet Take 2  mg by mouth every 6 (six) hours as needed for anxiety.    [provider]  methylPREDNISolone (MEDROL DOSEPAK) 4 MG TBPK tablet Use as directed x 6 days. Do not take celebrex while on this medication. Can restart Celebrex after you finish dose pack. 08/15/22   Robert Leach, PA-C  metroNIDAZOLE (METROGEL) 0.75 % gel Apply 1 Application topically 2 (two) times daily. 04/17/19   [provider]  montelukast (SINGULAIR) 10 MG tablet Take 10 mg by mouth at bedtime.    [provider]  mupirocin ointment (BACTROBAN) 2 % Apply 1 Application topically 2 (two) times daily.    [provider]  nystatin (MYCOSTATIN/NYSTOP) powder Apply 1 Application  topically 2 (two) times daily. 04/03/19   [provider]  pantoprazole (PROTONIX) 40 MG tablet Take 40 mg by mouth daily as needed.    [provider]  senna (SENOKOT) 8.6 MG TABS tablet Take 1 tablet (8.6 mg total) by mouth 2 (two) times daily as needed for mild constipation. 04/29/22   Susanne Borders, PA  SYNTHROID 137 MCG tablet Take 137 mcg by mouth at bedtime.    [provider]    DATA REVIEWED    Imaging Studies  MRI L spine 10/17/22 1.  Severe canal stenosis at L2-L3, secondary to broad-based disc bulge with superimposed left subarticular zone disc protrusion and ligamentum flavum as well as facet hypertrophy. Associated crowding of the cauda equina nerve roots and complete effacement of the CSF.  2.  Additional areas of lumbar spondylosis, with mild to moderate canal and foraminal stenosis further described above.  3.  Apparent right hemilaminotomy/laminectomy defects at L2-3 and L4-5, recommend correlation with surgical history.  4.  Diffuse heterogeneous marrow signal is nonspecific, but can be seen in individuals with chronic anemia, chronic hypoxemia (such as smoking) versus other marrow infiltrative/myeloproliferative disorders.  5.  Nonspecific feathery edema within the right paraspinous musculature spanning L3-L5. This could represent muscular strain or postoperative change. No fluid collection seen.   IMPRESSION  Robert Green is a 62 y.o. male who I performed a telephone encounter today for evaluation and management of: Lumbar stenosis  PLAN  Robert Green is a very pleasant 62 year old presenting today with ongoing back and left radiating leg pain.  His updated MRI does show central canal stenosis at L 2- 3 which could be the cause of his pain.  Would like to consider additional conservative management and we discussed potentially undergoing additional epidural steroid injections.  He has requested a referral back to the [facility] in [place].   I will place a referral for this.  I would like for them to consider L2-3 interlaminar ESI's.  He will contact us should he fail to improve with conservative management and we will get him scheduled to discuss further intervention with Dr. Myer Haff.  In the meantime I have given him prescription for Celebrex.  No orders of the defined types were placed in this encounter.    DISPOSITION  Follow up: In person appointment in  PRN  Susanne Borders, PA   TELEPHONE DOCUMENTATION   This visit was performed via telephone.  Patient location: home Provider location: office  I spent a total of 15 minutes non-face-to-face activities for this visit on the date of this encounter including review of current clinical condition and response to treatment.  The patient is aware of and accepts the limits of this telehealth visit.

## 2022-12-18 MED ORDER — METHOCARBAMOL 500 MG PO TABS: 500.0000 mg | ORAL_TABLET | Freq: Four times a day (QID) | ORAL | 0 refills | Status: DC

## 2023-02-25 HISTORY — PX: OTHER SURGICAL HISTORY: SHX169

## 2023-03-09 DIAGNOSIS — R918 Other nonspecific abnormal finding of lung field: Secondary | ICD-10-CM | POA: Insufficient documentation

## 2023-04-15 ENCOUNTER — Other Ambulatory Visit: Payer: Self-pay | Admitting: Neurosurgery

## 2023-04-15 ENCOUNTER — Telehealth: Payer: Self-pay

## 2023-04-15 MED ORDER — TRAMADOL HCL 50 MG PO TABS: 50.0000 mg | ORAL_TABLET | Freq: Two times a day (BID) | ORAL | 0 refills | Status: DC | PRN

## 2023-04-15 NOTE — Progress Notes (Signed)
 PDMP reviewed and appropriate. One time dose of tramadol sent in per mychart discussion

## 2023-04-15 NOTE — Telephone Encounter (Signed)
 Received authorization request from [employer] for tramadol. This has been submitted and is pending review. Key: [id number]

## 2023-04-16 NOTE — Telephone Encounter (Signed)
 Patient is aware that his medication has been approved.

## 2023-04-16 NOTE — Telephone Encounter (Signed)
 He confirmed appt for 04/28/2023

## 2023-04-16 NOTE — Telephone Encounter (Signed)
 Fax from [facility] [employer] approval from 04/15/2023-05/13/2023.

## 2023-04-25 HISTORY — PX: LUNG BIOPSY: SHX232

## 2023-04-28 ENCOUNTER — Encounter: Payer: Self-pay | Admitting: Neurosurgery

## 2023-04-28 ENCOUNTER — Ambulatory Visit: Payer: Self-pay | Admitting: Neurosurgery

## 2023-04-28 VITALS — BP 130/82 | Ht 76.0 in | Wt 329.0 lb

## 2023-04-28 DIAGNOSIS — Z9889 Other specified postprocedural states: Secondary | ICD-10-CM

## 2023-04-28 DIAGNOSIS — M545 Low back pain, unspecified: Secondary | ICD-10-CM

## 2023-04-28 DIAGNOSIS — M5416 Radiculopathy, lumbar region: Secondary | ICD-10-CM | POA: Diagnosis not present

## 2023-04-28 DIAGNOSIS — M48062 Spinal stenosis, lumbar region with neurogenic claudication: Secondary | ICD-10-CM | POA: Diagnosis not present

## 2023-04-28 NOTE — Progress Notes (Signed)
 Follow-up note: Referring Physician:  No referring provider defined for this encounter.  Primary Physician:  Robert Ferrara, MD  Chief Complaint: Ongoing low back and  History of Present Illness: Robert Green is a 63 y.o. male who presents with the chief complaint of ongoing pain despite L2-3 interlaminar ESI on 12/10/22.  Unfortunately this did not provide him with any significant relief. He would like to attempt on more injection. Today he describes low back pain that starts in the midline on the right and radiates out to his right flank as well as similar symptoms on the left that radiates down to his left buttock.  He has worsening numbness and tingling into his left foot.  He is currently taking tramadol about once a day. Of note since his last visit, he had COVID and suffered a blood clot and is now on Eliquis which she is to remain on through June.  11/04/22 Robert Green is a 63 year old presenting today via telephone visit for review of his MRI.  Overall his symptoms are unchanged from his last visit.   09/16/22 Robert Green is a 63 y.o. male who presents with the chief complaint of about 2 months of sharp left sided low back pain.  He states that this feels like the pain he had in his left side chronically and is slightly worse.  It starts in the mid to low back in the midline and radiates to the left without involvement into the buttock or down the leg.  He states that it is worse with activity and improved some with rest.  He did undergo treatment with a Medrol Dosepak which did help for a couple of weeks however his symptoms flared back up about 1 to 2 weeks ago.  He is currently taking diclofenac and Flexeril which provides some relief.   07/17/22 [name] He was doing well at his last visit- he was cleared to go back to work as soon as he could tolerate the drive. He was to follow up with urology for some urinary issues that started prior to surgery.    Phone visit scheduled  to check on his progress.   He is doing very well. He is back at work. He has minimal back soreness and aching when he is in the car a lot (has to drive far for work). He has relief with motrin and heat. He has no pain the next day. He has intermittent pulling in his back. No leg pain.    He was diagnosed with cystitis and on antibiotics. No urinary issues since that time, but he is making a f/u with urology.   Review of Systems:  A 10 point review of systems is negative, and the pertinent positives and negatives detailed in the HPI.  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     Past Surgical History: Past Surgical History:  Procedure Laterality Date   CARDIAC CATHETERIZATION  2014   CARDIAC CATHETERIZATION  2010   CARDIAC ELECTROPHYSIOLOGY STUDY AND ABLATION  2018   CARDIAC ELECTROPHYSIOLOGY STUDY AND ABLATION  2014   COLON FISTULA REPAIR  2016   COLONOSCOPY WITH PROPOFOL N/A 02/11/2016   Procedure: COLONOSCOPY WITH PROPOFOL;  Surgeon: Christena Deem, MD;  Location: [facility] ENDOSCOPY;  Service: Endoscopy;  Laterality: N/A;   EXCISIONAL HEMORRHOIDECTOMY  2015   EYE SURGERY Left 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   KNEE ARTHROSCOPY Left 2012   KNEE ARTHROSCOPY Right 2014   LUMBAR LAMINECTOMY/DECOMPRESSION MICRODISCECTOMY N/A 04/28/2022   Procedure: L2-5 DECOMPRESSION;  Surgeon: Venetia Night, MD;  Location: [facility] ORS;  Service: Neurosurgery;  Laterality: N/A;   REPAIR OF CEREBROSPINAL FLUID LEAK  04/28/2022   Procedure: REPAIR OF CEREBROSPINAL FLUID LEAK;  Surgeon: Venetia Night, MD;  Location: [facility] ORS;  Service: Neurosurgery;;   RHINOPLASTY  2010   SHOULDER ARTHROSCOPY Right 2017    Allergies: Allergies as of 04/28/2023 - Review Complete 09/16/2022  Allergen Reaction Noted   Chlorhexidine Dermatitis, Itching, Rash, and Swelling 11/24/2016   Adhesive [tape] Dermatitis 02/08/2016   Tetracyclines & related     Citalopram Palpitations 02/08/2016    Medications: Outpatient Encounter Medications as of 04/28/2023  Medication Sig   albuterol (PROVENTIL HFA;VENTOLIN HFA) 108 (90 Base) MCG/ACT inhaler Inhale 2 puffs into the lungs every 6 (six) hours as needed for wheezing or shortness of breath.   celecoxib (CELEBREX) 200 MG capsule Take 1 capsule (200 mg total) by mouth 2 (two) times daily. Take with food.   cetirizine (ZYRTEC) 10 MG chewable tablet Chew 10 mg by mouth at bedtime.   diltiazem (CARDIZEM) 30 MG tablet Take 30 mg by mouth every 6 (six) hours as needed.   fluocinonide cream (LIDEX) 0.05 % Apply 1 Application topically daily as needed.   Fluticasone-Salmeterol (ADVAIR) 100-50 MCG/DOSE AEPB Inhale 1 puff into the lungs daily.    furosemide (LASIX) 20 MG tablet Take 20 mg by mouth as needed.    hydrocortisone (PROCTOSOL HC) 2.5 % rectal cream Proctosol HC 2.5 % topical cream perineal applicator  INSERT INTO THE RECTUM TWO (2) TIMES A DAY AS DIRECTED   LORazepam (ATIVAN) 2 MG tablet Take 2 mg by mouth every 6 (six) hours as needed for anxiety.   methocarbamol (ROBAXIN) 500 MG tablet Take 1 tablet (500 mg total) by mouth 4 (four) times daily.   methylPREDNISolone (MEDROL DOSEPAK) 4 MG TBPK tablet Use as directed x 6 days. Do not take celebrex while on this medication. Can restart Celebrex after you finish dose pack.   metroNIDAZOLE (METROGEL) 0.75 % gel Apply 1  Application topically 2 (two) times daily.   montelukast (SINGULAIR) 10 MG tablet Take 10 mg by mouth at bedtime.   mupirocin ointment (BACTROBAN) 2 % Apply 1 Application topically 2 (two) times daily.   nystatin (MYCOSTATIN/NYSTOP) powder Apply 1 Application topically 2 (two) times daily.   pantoprazole (PROTONIX) 40 MG tablet Take 40 mg by mouth daily as needed.   senna (SENOKOT) 8.6 MG TABS tablet Take 1 tablet (8.6 mg total) by mouth 2 (two) times daily as needed for mild constipation.   SYNTHROID 137 MCG tablet Take 137 mcg by mouth at bedtime.   traMADol (ULTRAM) 50 MG tablet Take 1 tablet (50 mg total) by mouth 2 (two) times daily as needed.  No facility-administered encounter medications on file as of 04/28/2023.    Social History: Social History   Tobacco Use   Smoking status: Former   Smokeless tobacco: Former    Types: Chew    Quit date: 03/04/2022  Vaping Use   Vaping status: Never Used  Substance Use Topics   Alcohol use: Yes    Alcohol/week: 1.0 standard drink of alcohol    Types: 1 Cans of beer per week    Comment: occassional   Drug use: No    Family Medical History: Family History  Problem Relation Age of Onset   Cancer Father    Diabetes Sister    Diabetes Brother     Exam: Today's Vitals   04/28/23 1143  BP: 130/82  Weight: (!) 149.2 kg  Height: 6\' 4"  (1.93 m)  PainSc: 4   PainLoc: Back   Body mass index is 40.05 kg/m.   General: A&O.  ROM of spine: WNL.  Palpation of spine: TTP pf left low back.  Strength in the left lower extremity is EHL 5/5, Dorsiflexion 5/5, Plantar flexion 5/5, Hamstring 5/5, Quadricep 4/5, Iliopsoas 4/5. Strength in the right lower extremity is EHL 5/5, Dorsiflexion 5/5, Plantar flexion 5/5, Hamstring 5/5, Quadricep 5/5, Iliopsoas 5/5. Reflexes are 1+ and symmetric at the patella and achilles.   Bilateral lower extremity sensation is intact to light touch.  Ambulates with an antalgic gait Imaging: MRI L-spine 10/30/22 1.   Severe canal stenosis at L2-L3, secondary to broad-based disc bulge with superimposed left subarticular zone disc protrusion and ligamentum flavum as well as facet hypertrophy. Associated crowding of the cauda equina nerve roots and complete effacement of the CSF.  2.  Additional areas of lumbar spondylosis, with mild to moderate canal and foraminal stenosis further described above.  3.  Apparent right hemilaminotomy/laminectomy defects at L2-3 and L4-5, recommend correlation with surgical history.  4.  Diffuse heterogeneous marrow signal is nonspecific, but can be seen in individuals with chronic anemia, chronic hypoxemia (such as smoking) versus other marrow infiltrative/myeloproliferative disorders.  5.  Nonspecific feathery edema within the right paraspinous musculature spanning L3-L5. This could represent muscular strain or postoperative change. No fluid collection seen.   I have personally reviewed the images and agree with the above interpretation.  Assessment and Plan: Mr. Baldree is a pleasant 63 y.o. male with ongoing low back and left radiating leg pain despite injections.  We discussed possible surgical intervention given his MRI results however he would like to attempt another injection if he is eligible.  He cannot have any neurosurgical intervention until he is able to come off of his Eliquis in June.  We also discussed physical therapy which she is interested in.  He would like to touch base with a physical therapy office in [place], [facility], to see if they still take his insurance.  He will send me a MyChart message and I will send the order at that time.  He will discuss possible medication management with his pain management provider however if they are unable to do so we discussed referral to [facility] in [place].  He is currently taking 1 tramadol a day as needed for pain.  I agreed to provide him with a refill in the interim should he need it while we are trying to find someone  to do medication management for him.  I spent a total of 45 minutes in both face-to-face and non-face-to-face activities for this visit on the date of this encounter including  review of records, discussion of symptoms, physical exam, discussion of treatment options, and documentation  [facility] Neurosurgery

## 2023-04-30 ENCOUNTER — Other Ambulatory Visit: Payer: Self-pay | Admitting: Neurosurgery

## 2023-04-30 DIAGNOSIS — Z9889 Other specified postprocedural states: Secondary | ICD-10-CM

## 2023-04-30 DIAGNOSIS — M48062 Spinal stenosis, lumbar region with neurogenic claudication: Secondary | ICD-10-CM

## 2023-04-30 MED ORDER — TRAMADOL HCL 50 MG PO TABS: 50.0000 mg | ORAL_TABLET | Freq: Two times a day (BID) | ORAL | 0 refills | Status: DC | PRN

## 2023-04-30 NOTE — Telephone Encounter (Signed)
 Referral sent

## 2023-06-04 DIAGNOSIS — I4891 Unspecified atrial fibrillation: Secondary | ICD-10-CM | POA: Insufficient documentation

## 2023-06-04 DIAGNOSIS — J849 Interstitial pulmonary disease, unspecified: Secondary | ICD-10-CM | POA: Insufficient documentation

## 2023-06-04 DIAGNOSIS — M51369 Other intervertebral disc degeneration, lumbar region without mention of lumbar back pain or lower extremity pain: Secondary | ICD-10-CM | POA: Insufficient documentation

## 2023-06-04 DIAGNOSIS — Z86718 Personal history of other venous thrombosis and embolism: Secondary | ICD-10-CM | POA: Insufficient documentation

## 2023-08-11 NOTE — Telephone Encounter (Signed)
 Patient scheduled.

## 2023-08-13 ENCOUNTER — Encounter: Payer: Self-pay | Admitting: Neurosurgery

## 2023-08-13 ENCOUNTER — Other Ambulatory Visit: Payer: Self-pay

## 2023-08-13 ENCOUNTER — Ambulatory Visit: Admitting: Neurosurgery

## 2023-08-13 VITALS — BP 118/68 | Ht 76.0 in | Wt 347.2 lb

## 2023-08-13 DIAGNOSIS — M533 Sacrococcygeal disorders, not elsewhere classified: Secondary | ICD-10-CM | POA: Insufficient documentation

## 2023-08-13 DIAGNOSIS — M7710 Lateral epicondylitis, unspecified elbow: Secondary | ICD-10-CM | POA: Insufficient documentation

## 2023-08-13 DIAGNOSIS — M2340 Loose body in knee, unspecified knee: Secondary | ICD-10-CM | POA: Insufficient documentation

## 2023-08-13 DIAGNOSIS — M23329 Other meniscus derangements, posterior horn of medial meniscus, unspecified knee: Secondary | ICD-10-CM | POA: Insufficient documentation

## 2023-08-13 DIAGNOSIS — M25469 Effusion, unspecified knee: Secondary | ICD-10-CM | POA: Insufficient documentation

## 2023-08-13 DIAGNOSIS — M6281 Muscle weakness (generalized): Secondary | ICD-10-CM | POA: Insufficient documentation

## 2023-08-13 DIAGNOSIS — M2240 Chondromalacia patellae, unspecified knee: Secondary | ICD-10-CM | POA: Insufficient documentation

## 2023-08-13 DIAGNOSIS — M48062 Spinal stenosis, lumbar region with neurogenic claudication: Secondary | ICD-10-CM | POA: Diagnosis not present

## 2023-08-13 DIAGNOSIS — Z01818 Encounter for other preprocedural examination: Secondary | ICD-10-CM

## 2023-08-13 DIAGNOSIS — M25669 Stiffness of unspecified knee, not elsewhere classified: Secondary | ICD-10-CM | POA: Insufficient documentation

## 2023-08-13 DIAGNOSIS — M5416 Radiculopathy, lumbar region: Secondary | ICD-10-CM

## 2023-08-13 NOTE — Progress Notes (Signed)
 Follow-up note: Referring Physician:  No referring provider defined for this encounter.  Primary Physician:  No primary care provider on file.  Chief Complaint: back and leg pain  History of Present Illness: 08/13/23 Robert Green is a 63 year old male with non-specific interstitial lung disease who presents with worsening back and leg pain.  He experiences significant back pain radiating down his right leg into the buttock and occasionally to the back of the leg. The pain starts in the back, worsening throughout the day, beginning at a level of 1 in the morning and increasing to 4 or 5 by the end of the day. If he sleeps more than six hours, his back hurts upon waking. He has difficulty walking and cannot walk long distances without stopping due to pain and breathing difficulties. He can stand for about 15 to 20 minutes before the back pain becomes intolerable. He also experiences cramps but not pain on the front of his thigh, and denies any tingling or numbness in that area. He finds himself bending forward when walking and relies on shopping carts for support when in stores.  He had surgery last year and did not experience similar back pain prior to that. The pain began to worsen in August of last year. He had a PET scan done three to four months ago. He is currently on chronic steroids, having been on them for four months, off for a month and a half, and back on for three weeks. He also takes Eliquis following a DVT in January, which was likely triggered by a COVID infection. He is scheduled for a prostate procedure next week.  He reports breathing difficulties that have remained consistent since his last surgery a year ago. No pain on the front of his thigh but reports cramps. No tingling or numbness on the front of his thigh.  He has done physical therapy without improvement.   04/28/23 by Anastacio Karvonen, PA-C Robert Green is a 63 y.o. male who presents with the chief  complaint of ongoing pain despite L2-3 interlaminar ESI on 12/10/22.  Unfortunately this did not provide him with any significant relief. He would like to attempt on more injection. Today he describes low back pain that starts in the midline on the right and radiates out to his right flank as well as similar symptoms on the left that radiates down to his left buttock.  He has worsening numbness and tingling into his left foot.  He is currently taking tramadol  about once a day. Of note since his last visit, he had COVID and suffered a blood clot and is now on Eliquis which she is to remain on through June.  11/04/22 Robert Green is a 63 year old presenting today via telephone visit for review of his MRI.  Overall his symptoms are unchanged from his last visit.   09/16/22 Robert Green is a 63 y.o. male who presents with the chief complaint of about 2 months of sharp left sided low back pain.  He states that this feels like the pain he had in his left side chronically and is slightly worse.  It starts in the mid to low back in the midline and radiates to the left without involvement into the buttock or down the leg.  He states that it is worse with activity and improved some with rest.  He did undergo treatment with a Medrol  Dosepak which did help for a couple of weeks however his symptoms flared back  up about 1 to 2 weeks ago.  He is currently taking diclofenac and Flexeril which provides some relief.   07/17/22 Robert Green He was doing well at his last visit- he was cleared to go back to work as soon as he could tolerate the drive. He was to follow up with urology for some urinary issues that started prior to surgery.    Phone visit scheduled to check on his progress.   He is doing very well. He is back at work. He has minimal back soreness and aching when he is in the car a lot (has to drive far for work). He has relief with motrin and heat. He has no pain the next day. He has intermittent pulling in his  back. No leg pain.    He was diagnosed with cystitis and on antibiotics. No urinary issues since that time, but he is making a f/u with urology.   Review of Systems:  A 10 point review of systems is negative, and the pertinent positives and negatives detailed in the HPI.  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     Past Surgical History: Past Surgical History:  Procedure Laterality Date   CARDIAC CATHETERIZATION  2014   CARDIAC CATHETERIZATION  2010   CARDIAC ELECTROPHYSIOLOGY STUDY AND ABLATION  2018   CARDIAC ELECTROPHYSIOLOGY STUDY AND ABLATION  2014   COLON FISTULA REPAIR  2016   COLONOSCOPY WITH PROPOFOL  N/A 02/11/2016   Procedure: COLONOSCOPY WITH PROPOFOL ;  Surgeon: Deveron Fly, MD;  Location: [facility] ENDOSCOPY;  Service: Endoscopy;  Laterality: N/A;   EXCISIONAL HEMORRHOIDECTOMY  2015   EYE SURGERY Left 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   KNEE ARTHROSCOPY Left 2012   KNEE ARTHROSCOPY Right 2014   LUMBAR LAMINECTOMY/DECOMPRESSION MICRODISCECTOMY N/A 04/28/2022   Procedure: L2-5 DECOMPRESSION;  Surgeon: Jodeen Munch, MD;  Location: [facility] ORS;  Service: Neurosurgery;  Laterality: N/A;   REPAIR OF CEREBROSPINAL FLUID LEAK  04/28/2022   Procedure: REPAIR OF CEREBROSPINAL FLUID LEAK;  Surgeon: Jodeen Munch, MD;  Location: [facility] ORS;  Service: Neurosurgery;;   RHINOPLASTY   2010   SHOULDER ARTHROSCOPY Right 2017    Allergies: Allergies as of 08/13/2023 - Review Complete 04/28/2023  Allergen Reaction Noted   Chlorhexidine Dermatitis, Itching, Rash, and Swelling 11/24/2016   Adhesive [tape] Dermatitis 02/08/2016   Tetracyclines & related     Citalopram Palpitations 02/08/2016    Medications: Outpatient Encounter Medications as of 08/13/2023  Medication Sig   albuterol  (PROVENTIL  HFA;VENTOLIN  HFA) 108 (90 Base) MCG/ACT inhaler Inhale 2 puffs into the lungs every 6 (six) hours as needed for wheezing or shortness of breath.   cetirizine (ZYRTEC) 10 MG chewable tablet Chew 10 mg by mouth at bedtime.   Cholecalciferol (D 1000) 25 MCG (1000 UT)  capsule Take 1 capsule by mouth daily.   clobetasol ointment (TEMOVATE) 0.05 % APPLY TO AFFECTED AREA TWICE DAILY FOR UP TO 14 DAYS. MAY USE 2-3 TIMES A WEEK FOR MAINTENANCE   diltiazem  (CARDIZEM ) 30 MG tablet Take 30 mg by mouth every 6 (six) hours as needed.   ELIQUIS 5 MG TABS tablet Take 1 tablet twice a day by oral route for 90 days.   furosemide  (LASIX ) 20 MG tablet Take 20 mg by mouth as needed.    hydrOXYzine  (ATARAX ) 25 MG tablet Take 25 mg by mouth every 8 (eight) hours as needed.   LORazepam (ATIVAN) 2 MG tablet Take 2 mg by mouth every 6 (six) hours as needed for anxiety.   magnesium  oxide (MAG-OX) 400 MG tablet Take by mouth.   methocarbamol  (ROBAXIN ) 500 MG tablet Take 1 tablet (500 mg total) by mouth 4 (four) times daily.   mometasone (NASONEX) 50 MCG/ACT nasal spray Place into the nose.   montelukast  (SINGULAIR ) 10 MG tablet Take 10 mg by mouth at bedtime.   mupirocin ointment (BACTROBAN) 2 % Apply 1 Application topically 2 (two) times daily.   pantoprazole  (PROTONIX ) 40 MG tablet Take 40 mg by mouth daily as needed.   predniSONE (DELTASONE) 20 MG tablet PLEASE SEE ATTACHED FOR DETAILED DIRECTIONS   SYNTHROID  137 MCG tablet Take 137 mcg by mouth at bedtime.   traMADol  (ULTRAM ) 50 MG tablet Take 1 tablet  (50 mg total) by mouth 2 (two) times daily as needed.   No facility-administered encounter medications on file as of 08/13/2023.    Social History: Social History   Tobacco Use   Smoking status: Former   Smokeless tobacco: Former    Types: Chew    Quit date: 03/04/2022  Vaping Use   Vaping status: Never Used  Substance Use Topics   Alcohol use: Yes    Alcohol/week: 1.0 standard drink of alcohol    Types: 1 Cans of beer per week    Comment: occassional   Drug use: No    Family Medical History: Family History  Problem Relation Age of Onset   Cancer Father    Diabetes Sister    Diabetes Brother     Exam: There were no vitals filed for this visit.  There is no height or weight on file to calculate BMI.   General: A&O.  ROM of spine: WNL.  Palpation of spine: TTP pf left low back.  Strength in the left lower extremity is EHL 5/5, Dorsiflexion 5/5, Plantar flexion 5/5, Hamstring 5/5, Quadricep 4+/5, Iliopsoas 4/5. Strength in the right lower extremity is EHL 5/5, Dorsiflexion 5/5, Plantar flexion 5/5, Hamstring 5/5, Quadricep 5/5, Iliopsoas 5/5. Reflexes are 1+ and symmetric at the patella and achilles.   Bilateral lower extremity sensation is intact to light touch.  Ambulates with an antalgic gait   Imaging: MRI L-spine 10/30/22 1.  Severe canal stenosis at L2-L3, secondary to broad-based disc bulge with superimposed left subarticular zone disc protrusion and ligamentum flavum as well as facet hypertrophy. Associated crowding of the cauda equina nerve roots and complete effacement of the CSF.  2.  Additional areas of lumbar spondylosis, with mild to moderate canal and foraminal stenosis further described above.  3.  Apparent right hemilaminotomy/laminectomy defects at L2-3 and L4-5, recommend correlation with surgical history.  4.  Diffuse heterogeneous marrow signal is nonspecific, but can be seen in individuals with chronic anemia, chronic hypoxemia (such as smoking) versus other  marrow infiltrative/myeloproliferative disorders.  5.  Nonspecific feathery edema within the right paraspinous musculature spanning L3-L5. This could represent muscular strain or postoperative change. No fluid collection seen.   I have personally reviewed the images and agree with the above interpretation.  Assessment and Plan: Robert Green is a pleasant 63 y.o. male with ongoing low back and radiating leg pain despite injections and physical therapy.  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  He has a disc herniation at L2-3.  He has severe stenosis at L2-3 as well as moderate stenosis at L3-4 and right sided lateral recess stenosis at L4-5.  He has tried and failed conservative management.  I recommended lumbar decompression with a left-sided L2-3 microdiscectomy and L3-5 decompression.  He has failed a minimally invasive approach, so we will plan on an open approach.  I discussed the planned procedure at length with the patient, including the risks, benefits, alternatives, and indications. The risks discussed include but are not limited to bleeding, infection, need for reoperation, spinal fluid leak, stroke, vision loss, anesthetic complication, coma, paralysis, and even death. I also described in detail that improvement was not guaranteed.  The patient expressed understanding of these risks, and asked that we proceed with surgery. I described the surgery in layman's terms, and gave ample opportunity for questions, which were answered to the best of my ability.  He has patient specific risk factors such as his pulmonary disease which elevate the risk of the surgery.  I spent a total of 30 minutes in this patient's care today. This time was spent reviewing pertinent records including imaging studies, obtaining and confirming history, performing a directed evaluation, formulating and discussing my recommendations, and documenting the visit within the medical record.     [facility] Neurosurgery

## 2023-08-13 NOTE — Addendum Note (Signed)
 Addended by: Paulene Tayag on: 08/13/2023 06:05 PM   Modules accepted: Orders

## 2023-08-13 NOTE — Addendum Note (Signed)
 Addended by: Angeliah Wisdom on: 08/13/2023 06:36 PM   Modules accepted: Orders

## 2023-08-13 NOTE — Patient Instructions (Signed)
 Please see below for information in regards to your upcoming surgery:   Planned surgery: Left L2-3 microdiscectomy, L3-4 & L4-5 decompression   Surgery date: 09/16/23 at [facility] (Medical Mall: [address], [place], [place] [address]) - you will find out your arrival time the business day before your surgery.   Pre-op appointment at [facility] Pre-admit Testing: you will receive a call with a date/time for this appointment. If you are scheduled for an in person appointment, Pre-admit Testing is located on the first floor of the [facility] Arts building, [address] [facility], [address]. During this appointment, they will advise you which medications you can take the morning of surgery, and which medications you will need to hold for surgery. Labs (such as blood work, EKG) may be done at your pre-op appointment. You are not required to fast for these labs. Should you need to change your pre-op appointment, please call Pre-admit testing at [phone].      Prednisone: hold for 5 days prior to surgery   Blood thinners:   Eliquis:   stop Eliquis 3 days prior, resume Eliquis 14 days after     Surgical clearance: we will send a clearance form to Torilyn Quesenberry, FNP-C (Primary Care Provider) and Pamela Boeck, MD (Pulmonology). They may wish to see you in their office prior to signing the clearance form. If so, they may call you to schedule an appointment.      Common restrictions after surgery: No bending, lifting, or twisting ("BLT"). Avoid lifting objects heavier than 10 pounds for the first 6 weeks after surgery. Where possible, avoid household activities that involve lifting, bending, reaching, pushing, or pulling such as laundry, vacuuming, grocery shopping, and childcare. Try to arrange for help from friends and family for these activities while you heal. Do not drive while taking prescription pain medication. Weeks 6 through 12 after surgery:  avoid lifting more than 25 pounds.     How to contact us :  If you have any questions/concerns before or after surgery, you can reach us  at [phone], or you can send a mychart message. We can be reached by phone or mychart 8am-4pm, Monday-Friday.  *Please note: Calls after 4pm are forwarded to a third party answering service. Mychart messages are not routinely monitored during evenings, weekends, and holidays. Please call our office to contact the answering service for urgent concerns during non-business hours.    If you have FMLA/disability paperwork, please drop it off or fax it to [phone], attention Patty.   Appointments/FMLA & disability paperwork: Gerlean Kocher, & Maryann Smalls Registered Nurses/Surgery schedulers: Kashira Behunin & Lauren Medical Assistants: Donnajean Fuse Physician Assistants: Ludwig Safer, PA-C, Anastacio Karvonen, PA-C & Lucetta Russel, PA-C Surgeons: Jodeen Munch, MD & Henderson Lock, MD   [facility] PREADMIT TESTING VISIT and SURGERY INFORMATION SHEET   Now that surgery has been scheduled you can anticipate several phone calls from [facility] services. A pharmacy technician will call you to verify your current list of medications taken at home.               The Pre-Service Center will call to verify your insurance information and to give you billing estimates and information.             The Preadmit Testing Office will be calling to schedule a visit to obtain information for the anesthesia team and provide instructions on preparation for surgery.  What can you expect for the Preadmit Testing Visit: Appointments  may be scheduled in-person or by telephone.  If a telephone visit is scheduled, you may be asked to come into the office to have lab tests or other studies performed.   This visit will not be completed any greater than 14 days prior to your surgery.  If your surgery has been scheduled for a future date, please do not be alarmed if we  have not contacted you to schedule an appointment more than a month prior to the surgery date.    Please be prepared to provide the following information during this appointment:            -Personal medical history                                               -Medication and allergy list            -Any history of problems with anesthesia              -Recent lab work or diagnostic studies            -Please notify us  of any needs we should be aware of to provide the best care possible           -You will be provided with instructions on how to prepare for your surgery.    On The Day of Surgery:  You must have a driver to take you home after surgery, you will be asked not to drive for 24 hours following surgery.  Taxi, [employer] and non-medical transport will not be acceptable means of transportation unless you have a responsible individual who will be traveling with you.  Visitors in the surgical area:   2 people will be able to visit you in your room once your preparation for surgery has been completed. During surgery, your visitors will be asked to wait in the Surgery Waiting Area.  It is not a requirement for them to stay, if they prefer to leave and come back.  Your visitor(s) will be given an update once the surgery has been completed.  No visitors are allowed in the initial recovery room to respect patient privacy and safety.  Once you are more awake and transfer to the secondary recovery area, or are transferred to an inpatient room, visitors will again be able to see you.  To respect and protect your privacy: We will ask on the day of surgery who your driver will be and what the contact number for that individual will be. We will ask if it is okay to share information with this individual, or if there is an alternative individual that we, or the surgeon, should contact to provide updates and information. If family or friends come to the surgical information desk requesting information  about you, who you have not listed with us , no information will be given.   It may be helpful to designate someone as the main contact who will be responsible for updating your other friends and family.    PREADMIT TESTING OFFICE: [phone] SAME DAY SURGERY: [phone] We look forward to caring for you before and throughout the process of your surgery.

## 2023-09-03 ENCOUNTER — Other Ambulatory Visit: Payer: Self-pay

## 2023-09-03 ENCOUNTER — Encounter
Admission: RE | Admit: 2023-09-03 | Discharge: 2023-09-03 | Disposition: A | Discharge status: Home / Self Care | Source: Ambulatory Visit | Attending: Neurosurgery | Admitting: Neurosurgery

## 2023-09-03 VITALS — BP 125/76 | HR 57 | Resp 16 | Ht 76.0 in | Wt 335.0 lb

## 2023-09-03 DIAGNOSIS — R319 Hematuria, unspecified: Secondary | ICD-10-CM | POA: Insufficient documentation

## 2023-09-03 DIAGNOSIS — I272 Pulmonary hypertension, unspecified: Secondary | ICD-10-CM | POA: Diagnosis not present

## 2023-09-03 DIAGNOSIS — I4891 Unspecified atrial fibrillation: Secondary | ICD-10-CM | POA: Diagnosis not present

## 2023-09-03 DIAGNOSIS — R8281 Pyuria: Secondary | ICD-10-CM | POA: Diagnosis not present

## 2023-09-03 DIAGNOSIS — Z01812 Encounter for preprocedural laboratory examination: Secondary | ICD-10-CM | POA: Insufficient documentation

## 2023-09-03 DIAGNOSIS — Z01818 Encounter for other preprocedural examination: Secondary | ICD-10-CM

## 2023-09-03 DIAGNOSIS — R8290 Unspecified abnormal findings in urine: Secondary | ICD-10-CM | POA: Insufficient documentation

## 2023-09-03 DIAGNOSIS — M5416 Radiculopathy, lumbar region: Secondary | ICD-10-CM

## 2023-09-03 HISTORY — DX: Interstitial pulmonary disease, unspecified: J84.9

## 2023-09-03 HISTORY — DX: Dyspnea, unspecified: R06.00

## 2023-09-03 HISTORY — DX: Sarcoidosis, unspecified: D86.9

## 2023-09-03 HISTORY — DX: Essential (primary) hypertension: I10

## 2023-09-03 LAB — URINALYSIS, ROUTINE W REFLEX MICROSCOPIC
Bacteria, UA: NONE SEEN
Bilirubin Urine: NEGATIVE
Glucose, UA: NEGATIVE mg/dL
Ketones, ur: NEGATIVE mg/dL
Nitrite: NEGATIVE
Protein, ur: 100 mg/dL — AB
RBC / HPF: >50 RBC/hpf (ref 0–5)
Specific Gravity, Urine: 1.03 (ref 1.005–1.030)
WBC, UA: >50 WBC/hpf (ref 0–5)
pH: 5 (ref 5.0–8.0)

## 2023-09-03 LAB — CBC
HCT: 42.1 % (ref 39.0–52.0)
Hemoglobin: 14.3 g/dL (ref 13.0–17.0)
MCH: 30.1 pg (ref 26.0–34.0)
MCHC: 34 g/dL (ref 30.0–36.0)
MCV: 88.6 fL (ref 80.0–100.0)
Platelets: 241 K/uL (ref 150–400)
RBC: 4.75 MIL/uL (ref 4.22–5.81)
RDW: 13.1 % (ref 11.5–15.5)
WBC: 7.3 K/uL (ref 4.0–10.5)
nRBC: 0 % (ref 0.0–0.2)

## 2023-09-03 LAB — BASIC METABOLIC PANEL WITH GFR
Anion gap: 8 (ref 5–15)
BUN: 13 mg/dL (ref 8–23)
CO2: 24 mmol/L (ref 22–32)
Calcium: 9.1 mg/dL (ref 8.9–10.3)
Chloride: 105 mmol/L (ref 98–111)
Creatinine, Ser: 0.93 mg/dL (ref 0.61–1.24)
GFR, Estimated: >60 mL/min (ref >60)
Glucose, Bld: 96 mg/dL (ref 70–99)
Potassium: 3.7 mmol/L (ref 3.5–5.1)
Sodium: 137 mmol/L (ref 135–145)

## 2023-09-03 LAB — TYPE AND SCREEN
ABO/RH(D): O POS
Antibody Screen: NEGATIVE

## 2023-09-03 LAB — SURGICAL PCR SCREEN
MRSA, PCR: NEGATIVE
Staphylococcus aureus: NEGATIVE

## 2023-09-03 NOTE — Patient Instructions (Addendum)
 Your procedure is scheduled on: 09/16/23 - Wednesday Report to the Registration Desk on the 1st floor of the Medical Mall. To find out your arrival time, please call [phone] between 1PM - 3PM on: 09/15/23 - Tuesday If your arrival time is 6:00 am, do not arrive before that time as the Medical Mall entrance doors do not open until 6:00 am.  REMEMBER: Instructions that are not followed completely may result in serious medical risk, up to and including death; or upon the discretion of your surgeon and anesthesiologist your surgery may need to be rescheduled.  Do not eat food after midnight the night before surgery.  No gum chewing or hard candies.  You may however, drink CLEAR liquids up to 2 hours before you are scheduled to arrive for your surgery. Do not drink anything within 2 hours of your scheduled arrival time.  Clear liquids include: - water  - apple juice without pulp - gatorade (not RED colors) - black coffee or tea (Do NOT add milk or creamers to the coffee or tea) Do NOT drink anything that is not on this list.   You may take these if needed,  Anti-inflammatories (NSAIDS) such as Advil, Aleve, Ibuprofen, Motrin, Naproxen, Naprosyn and Aspirin based products such as Excedrin, Goody's Powder, BC Powder. You may take Tylenol  if needed for pain up until the day of surgery.  Stop ANY OVER THE COUNTER supplements until after surgery : Cholecalciferol , magnesium  oxide .  We have instructed the patient to hold the following medications for surgery as listed below:   Prednisone: hold for 5 days prior to surgery  Eliquis:  stop Eliquis 3 days prior, resume Eliquis 14 days after   You may take Tylenol  if needed for pain up until the day of surgery.  ON THE DAY OF SURGERY ONLY TAKE THESE MEDICATIONS WITH SIPS OF WATER:  pantoprazole  (PROTONIX )  SYNTHROID    Use inhalers albuterol  (PROVENTIL )  on the day of surgery and bring to the hospital.   No Alcohol for 24 hours before  or after surgery.  No Smoking including e-cigarettes for 24 hours before surgery.  No chewable tobacco products for at least 6 hours before surgery.  No nicotine patches on the day of surgery.  Do not use any recreational drugs for at least a week (preferably 2 weeks) before your surgery.  Please be advised that the combination of cocaine and anesthesia may have negative outcomes, up to and including death. If you test positive for cocaine, your surgery will be cancelled.  On the morning of surgery brush your teeth with toothpaste and water, you may rinse your mouth with mouthwash if you wish. Do not swallow any toothpaste or mouthwash.  Do not wear jewelry, make-up, hairpins, clips or nail polish.  For welded (permanent) jewelry: bracelets, anklets, waist bands, etc.  Please have this removed prior to surgery.  If it is not removed, there is a chance that hospital personnel will need to cut it off on the day of surgery.  Do not wear lotions, powders, or perfumes.   Do not shave body hair from the neck down 48 hours before surgery.  Contact lenses, hearing aids and dentures may not be worn into surgery.  Do not bring valuables to the hospital. [facility] is not responsible for any missing/lost belongings or valuables.   Bring your C-PAP to the hospital in case you may have to spend the night.   Notify your doctor if there is any change  in your medical condition (cold, fever, infection).  Wear comfortable clothing (specific to your surgery type) to the hospital.  After surgery, you can help prevent lung complications by doing breathing exercises.  Take deep breaths and cough every 1-2 hours. Your doctor may order a device called an Incentive Spirometer to help you take deep breaths.  When coughing or sneezing, hold a pillow firmly against your incision with both hands. This is called "splinting." Doing this helps protect your incision. It also decreases belly discomfort.  If you  are being admitted to the hospital overnight, leave your suitcase in the car. After surgery it may be brought to your room.  In case of increased patient census, it may be necessary for you, the patient, to continue your postoperative care in the Same Day Surgery department.  If you are being discharged the day of surgery, you will not be allowed to drive home. You will need a responsible individual to drive you home and stay with you for 24 hours after surgery.   If you are taking public transportation, you will need to have a responsible individual with you.  Please call the Pre-admissions Testing Dept. at [phone] if you have any questions about these instructions.  Surgery Visitation Policy:  Patients having surgery or a procedure may have two visitors.  Children under the age of 62 must have an adult with them who is not the patient.  Inpatient Visitation:    Visiting hours are 7 a.m. to 8 p.m. Up to four visitors are allowed at one time in a patient room. The visitors may rotate out with other people during the day.  One visitor age 6 or older may stay with the patient overnight and must be in the room by 8 p.m.   Merchandiser, retail to address health-related social needs:  [email or web address]    Pre-operative 5 CHG Bath Instructions   You can play a key role in reducing the risk of infection after surgery. Your skin needs to be as free of germs as possible. You can reduce the number of germs on your skin by washing with CHG (chlorhexidine gluconate) soap before surgery. CHG is an antiseptic soap that kills germs and continues to kill germs even after washing.   DO NOT use if you have an allergy to chlorhexidine/CHG or antibacterial soaps. If your skin becomes reddened or irritated, stop using the CHG and notify one of our RNs at [phone].   Please shower with the CHG soap starting 4 days before surgery using the following schedule: 07/19 -  07/23.    Please keep in mind the following:  DO NOT shave, including legs and underarms, starting the day of your first shower.   You may shave your face at any point before/day of surgery.  Place clean sheets on your bed the day you start using CHG soap. Use a clean washcloth (not used since being washed) for each shower. DO NOT sleep with pets once you start using the CHG.   CHG Shower Instructions:  If you choose to wash your hair and private area, wash first with your normal shampoo/soap.  After you use shampoo/soap, rinse your hair and body thoroughly to remove shampoo/soap residue.  Turn the water OFF and apply about 3 tablespoons (45 ml) of CHG soap to a CLEAN washcloth.  Apply CHG soap ONLY FROM YOUR NECK DOWN TO YOUR TOES (washing for 3-5 minutes)  DO NOT use CHG soap on face, private areas, open wounds,  or sores.  Pay special attention to the area where your surgery is being performed.  If you are having back surgery, having someone wash your back for you may be helpful. Wait 2 minutes after CHG soap is applied, then you may rinse off the CHG soap.  Pat dry with a clean towel  Put on clean clothes/pajamas   If you choose to wear lotion, please use ONLY the CHG-compatible lotions on the back of this paper.     Additional instructions for the day of surgery: DO NOT APPLY any lotions, deodorants, cologne, or perfumes.   Put on clean/comfortable clothes.  Brush your teeth.  Ask your nurse before applying any prescription medications to the skin.      CHG Compatible Lotions   Aveeno Moisturizing lotion  Cetaphil Moisturizing Cream  Cetaphil Moisturizing Lotion  Clairol Herbal Essence Moisturizing Lotion, Dry Skin  Clairol Herbal Essence Moisturizing Lotion, Extra Dry Skin  Clairol Herbal Essence Moisturizing Lotion, Normal Skin  Curel Age Defying Therapeutic Moisturizing Lotion with Alpha Hydroxy  Curel Extreme Care Body Lotion  Curel Soothing Hands Moisturizing Hand  Lotion  Curel Therapeutic Moisturizing Cream, Fragrance-Free  Curel Therapeutic Moisturizing Lotion, Fragrance-Free  Curel Therapeutic Moisturizing Lotion, Original Formula  Eucerin Daily Replenishing Lotion  Eucerin Dry Skin Therapy Plus Alpha Hydroxy Crme  Eucerin Dry Skin Therapy Plus Alpha Hydroxy Lotion  Eucerin Original Crme  Eucerin Original Lotion  Eucerin Plus Crme Eucerin Plus Lotion  Eucerin TriLipid Replenishing Lotion  Keri Anti-Bacterial Hand Lotion  Keri Deep Conditioning Original Lotion Dry Skin Formula Softly Scented  Keri Deep Conditioning Original Lotion, Fragrance Free Sensitive Skin Formula  Keri Lotion Fast Absorbing Fragrance Free Sensitive Skin Formula  Keri Lotion Fast Absorbing Softly Scented Dry Skin Formula  Keri Original Lotion  Keri Skin Renewal Lotion Keri Silky Smooth Lotion  Keri Silky Smooth Sensitive Skin Lotion  Nivea Body Creamy Conditioning Oil  Nivea Body Extra Enriched Teacher, adult education Moisturizing Lotion Nivea Crme  Nivea Skin Firming Lotion  NutraDerm 30 Skin Lotion  NutraDerm Skin Lotion  NutraDerm Therapeutic Skin Cream  NutraDerm Therapeutic Skin Lotion  ProShield Protective Hand Cream  Provon moisturizing lotion

## 2023-09-05 ENCOUNTER — Ambulatory Visit: Payer: Self-pay | Admitting: Urgent Care

## 2023-09-05 LAB — URINE CULTURE: Culture: <10000 — AB

## 2023-09-16 ENCOUNTER — Inpatient Hospital Stay
Admission: RE | Admit: 2023-09-16 | Discharge: 2023-09-20 | DRG: 519 | Disposition: A | Discharge status: Home / Self Care | Attending: Neurosurgery | Admitting: Neurosurgery

## 2023-09-16 ENCOUNTER — Ambulatory Visit

## 2023-09-16 ENCOUNTER — Ambulatory Visit: Payer: Self-pay | Admitting: Urgent Care

## 2023-09-16 ENCOUNTER — Ambulatory Visit: Admitting: Anesthesiology

## 2023-09-16 ENCOUNTER — Encounter
Admission: RE | Disposition: A | Discharge status: Home / Self Care | Payer: Self-pay | Source: Home / Self Care | Attending: Neurosurgery

## 2023-09-16 ENCOUNTER — Other Ambulatory Visit: Payer: Self-pay

## 2023-09-16 ENCOUNTER — Encounter: Payer: Self-pay | Admitting: Neurosurgery

## 2023-09-16 DIAGNOSIS — Z8616 Personal history of COVID-19: Secondary | ICD-10-CM

## 2023-09-16 DIAGNOSIS — Z86718 Personal history of other venous thrombosis and embolism: Secondary | ICD-10-CM

## 2023-09-16 DIAGNOSIS — M5116 Intervertebral disc disorders with radiculopathy, lumbar region: Secondary | ICD-10-CM | POA: Diagnosis present

## 2023-09-16 DIAGNOSIS — E781 Pure hyperglyceridemia: Secondary | ICD-10-CM | POA: Diagnosis present

## 2023-09-16 DIAGNOSIS — Z881 Allergy status to other antibiotic agents status: Secondary | ICD-10-CM

## 2023-09-16 DIAGNOSIS — Z7952 Long term (current) use of systemic steroids: Secondary | ICD-10-CM

## 2023-09-16 DIAGNOSIS — Z7409 Other reduced mobility: Secondary | ICD-10-CM | POA: Diagnosis present

## 2023-09-16 DIAGNOSIS — Z833 Family history of diabetes mellitus: Secondary | ICD-10-CM

## 2023-09-16 DIAGNOSIS — M5416 Radiculopathy, lumbar region: Secondary | ICD-10-CM | POA: Diagnosis not present

## 2023-09-16 DIAGNOSIS — M48062 Spinal stenosis, lumbar region with neurogenic claudication: Secondary | ICD-10-CM | POA: Diagnosis not present

## 2023-09-16 DIAGNOSIS — Z91048 Other nonmedicinal substance allergy status: Secondary | ICD-10-CM

## 2023-09-16 DIAGNOSIS — Z888 Allergy status to other drugs, medicaments and biological substances status: Secondary | ICD-10-CM

## 2023-09-16 DIAGNOSIS — E66813 Obesity, class 3: Secondary | ICD-10-CM | POA: Diagnosis present

## 2023-09-16 DIAGNOSIS — Z87891 Personal history of nicotine dependence: Secondary | ICD-10-CM

## 2023-09-16 DIAGNOSIS — G9741 Accidental puncture or laceration of dura during a procedure: Secondary | ICD-10-CM | POA: Diagnosis not present

## 2023-09-16 DIAGNOSIS — Z6841 Body Mass Index (BMI) 40.0 and over, adult: Secondary | ICD-10-CM

## 2023-09-16 DIAGNOSIS — I10 Essential (primary) hypertension: Secondary | ICD-10-CM | POA: Diagnosis present

## 2023-09-16 DIAGNOSIS — E039 Hypothyroidism, unspecified: Secondary | ICD-10-CM | POA: Diagnosis present

## 2023-09-16 DIAGNOSIS — J849 Interstitial pulmonary disease, unspecified: Secondary | ICD-10-CM | POA: Diagnosis present

## 2023-09-16 DIAGNOSIS — Z9889 Other specified postprocedural states: Principal | ICD-10-CM

## 2023-09-16 DIAGNOSIS — Z7901 Long term (current) use of anticoagulants: Secondary | ICD-10-CM

## 2023-09-16 DIAGNOSIS — Y848 Other medical procedures as the cause of abnormal reaction of the patient, or of later complication, without mention of misadventure at the time of the procedure: Secondary | ICD-10-CM | POA: Diagnosis not present

## 2023-09-16 DIAGNOSIS — Z79890 Hormone replacement therapy (postmenopausal): Secondary | ICD-10-CM

## 2023-09-16 DIAGNOSIS — Z01818 Encounter for other preprocedural examination: Secondary | ICD-10-CM

## 2023-09-16 DIAGNOSIS — J4489 Other specified chronic obstructive pulmonary disease: Secondary | ICD-10-CM | POA: Diagnosis present

## 2023-09-16 DIAGNOSIS — K219 Gastro-esophageal reflux disease without esophagitis: Secondary | ICD-10-CM | POA: Diagnosis present

## 2023-09-16 HISTORY — PX: LUMBAR LAMINECTOMY/DECOMPRESSION MICRODISCECTOMY: SHX5026

## 2023-09-16 SURGERY — LUMBAR LAMINECTOMY/DECOMPRESSION MICRODISCECTOMY 3 LEVELS
Anesthesia: General

## 2023-09-16 MED ORDER — MAGNESIUM CITRATE PO SOLN
1.0000 | Freq: Once | ORAL | Status: DC | PRN
  Filled 2023-09-16: qty 296

## 2023-09-16 MED ORDER — CELECOXIB 200 MG PO CAPS
ORAL_CAPSULE | ORAL | Status: AC
  Filled 2023-09-16: qty 1

## 2023-09-16 MED ORDER — DEXAMETHASONE SODIUM PHOSPHATE 10 MG/ML IJ SOLN
INTRAMUSCULAR | Status: DC | PRN
  Administered 2023-09-16: 10 mg via INTRAVENOUS

## 2023-09-16 MED ORDER — KETOROLAC TROMETHAMINE 15 MG/ML IJ SOLN
INTRAMUSCULAR | Status: AC
  Filled 2023-09-16: qty 1

## 2023-09-16 MED ORDER — KETOROLAC TROMETHAMINE 15 MG/ML IJ SOLN
7.5000 mg | Freq: Four times a day (QID) | INTRAMUSCULAR | Status: AC
  Administered 2023-09-16 (×2): 7.5 mg via INTRAVENOUS
  Administered 2023-09-17: 75 mg via INTRAVENOUS
  Administered 2023-09-17: 7.5 mg via INTRAVENOUS
  Filled 2023-09-16 (×3): qty 1

## 2023-09-16 MED ORDER — SODIUM CHLORIDE (PF) 0.9 % IJ SOLN: INTRAMUSCULAR | Status: DC | PRN

## 2023-09-16 MED ORDER — PROPOFOL 1000 MG/100ML IV EMUL
INTRAVENOUS | Status: AC
  Filled 2023-09-16: qty 100

## 2023-09-16 MED ORDER — SODIUM CHLORIDE (PF) 0.9 % IJ SOLN
INTRAMUSCULAR | Status: AC
  Filled 2023-09-16: qty 10

## 2023-09-16 MED ORDER — FENTANYL CITRATE (PF) 100 MCG/2ML IJ SOLN
INTRAMUSCULAR | Status: DC | PRN
  Administered 2023-09-16 (×2): 50 ug via INTRAVENOUS

## 2023-09-16 MED ORDER — SODIUM CHLORIDE 0.9% FLUSH: 3.0000 mL | INTRAVENOUS | Status: DC | PRN

## 2023-09-16 MED ORDER — FENTANYL CITRATE (PF) 100 MCG/2ML IJ SOLN
INTRAMUSCULAR | Status: AC
Start: 2023-09-16 — End: 2023-09-16
  Filled 2023-09-16: qty 2

## 2023-09-16 MED ORDER — ROCURONIUM BROMIDE 10 MG/ML (PF) SYRINGE: PREFILLED_SYRINGE | INTRAVENOUS | Status: DC | PRN

## 2023-09-16 MED ORDER — ONDANSETRON HCL 4 MG/2ML IJ SOLN
INTRAMUSCULAR | Status: DC | PRN
  Administered 2023-09-16: 4 mg via INTRAVENOUS

## 2023-09-16 MED ORDER — ACETAMINOPHEN 325 MG PO TABS: 650.0000 mg | ORAL_TABLET | ORAL | Status: DC | PRN

## 2023-09-16 MED ORDER — SUCCINYLCHOLINE CHLORIDE 200 MG/10ML IV SOSY
PREFILLED_SYRINGE | INTRAVENOUS | Status: DC | PRN
  Administered 2023-09-16: 160 mg via INTRAVENOUS

## 2023-09-16 MED ORDER — 0.9 % SODIUM CHLORIDE (POUR BTL) OPTIME
TOPICAL | Status: DC | PRN
  Administered 2023-09-16: 500 mL

## 2023-09-16 MED ORDER — OXYCODONE HCL 5 MG/5ML PO SOLN: 5.0000 mg | Freq: Once | ORAL | Status: AC | PRN

## 2023-09-16 MED ORDER — HYDROMORPHONE HCL 1 MG/ML IJ SOLN
0.5000 mg | INTRAMUSCULAR | Status: DC | PRN
  Administered 2023-09-16 – 2023-09-17 (×3): 0.5 mg via INTRAVENOUS
  Filled 2023-09-16 (×3): qty 0.5

## 2023-09-16 MED ORDER — LACTATED RINGERS IV SOLN: INTRAVENOUS | Status: DC

## 2023-09-16 MED ORDER — EPHEDRINE SULFATE-NACL 50-0.9 MG/10ML-% IV SOSY
PREFILLED_SYRINGE | INTRAVENOUS | Status: DC | PRN
  Administered 2023-09-16: 7.5 mg via INTRAVENOUS

## 2023-09-16 MED ORDER — FENTANYL CITRATE (PF) 100 MCG/2ML IJ SOLN
INTRAMUSCULAR | Status: AC
  Filled 2023-09-16: qty 2

## 2023-09-16 MED ORDER — BUPIVACAINE-EPINEPHRINE (PF) 0.5% -1:200000 IJ SOLN
INTRAMUSCULAR | Status: DC | PRN
  Administered 2023-09-16: 10 mL

## 2023-09-16 MED ORDER — OXYCODONE HCL 5 MG PO TABS
10.0000 mg | ORAL_TABLET | ORAL | Status: DC | PRN
  Administered 2023-09-16 – 2023-09-18 (×6): 10 mg via ORAL
  Filled 2023-09-16 (×6): qty 2

## 2023-09-16 MED ORDER — HYDROMORPHONE HCL 1 MG/ML IJ SOLN
INTRAMUSCULAR | Status: AC
  Filled 2023-09-16: qty 1

## 2023-09-16 MED ORDER — FIBRIN SEALANT 2 ML SINGLE DOSE KIT
PACK | CUTANEOUS | Status: DC | PRN
  Administered 2023-09-16: 2 mL via TOPICAL

## 2023-09-16 MED ORDER — OXYCODONE HCL 5 MG PO TABS: 5.0000 mg | ORAL_TABLET | ORAL | Status: DC | PRN

## 2023-09-16 MED ORDER — LIDOCAINE HCL (PF) 2 % IJ SOLN
INTRAMUSCULAR | Status: AC
  Filled 2023-09-16: qty 5

## 2023-09-16 MED ORDER — PHENOL 1.4 % MT LIQD
1.0000 | OROMUCOSAL | Status: DC | PRN
  Filled 2023-09-16: qty 177

## 2023-09-16 MED ORDER — CELECOXIB 200 MG PO CAPS
200.0000 mg | ORAL_CAPSULE | Freq: Once | ORAL | Status: AC
  Administered 2023-09-16: 200 mg via ORAL

## 2023-09-16 MED ORDER — PROPOFOL 10 MG/ML IV BOLUS
INTRAVENOUS | Status: AC
  Filled 2023-09-16: qty 40

## 2023-09-16 MED ORDER — CEFAZOLIN SODIUM-DEXTROSE 2-4 GM/100ML-% IV SOLN
INTRAVENOUS | Status: AC
Start: 2023-09-16 — End: 2023-09-16
  Filled 2023-09-16: qty 100

## 2023-09-16 MED ORDER — LEVOTHYROXINE SODIUM 137 MCG PO TABS
137.0000 ug | ORAL_TABLET | Freq: Every morning | ORAL | Status: DC
  Administered 2023-09-17 – 2023-09-20 (×4): 137 ug via ORAL
  Filled 2023-09-16 (×5): qty 1

## 2023-09-16 MED ORDER — BUPIVACAINE LIPOSOME 1.3 % IJ SUSP
INTRAMUSCULAR | Status: AC
  Filled 2023-09-16: qty 20

## 2023-09-16 MED ORDER — MIDAZOLAM HCL 2 MG/2ML IJ SOLN
INTRAMUSCULAR | Status: DC | PRN
  Administered 2023-09-16: 2 mg via INTRAVENOUS

## 2023-09-16 MED ORDER — LIDOCAINE HCL (PF) 2 % IJ SOLN
INTRAMUSCULAR | Status: DC | PRN
  Administered 2023-09-16: 100 mg via INTRADERMAL

## 2023-09-16 MED ORDER — MIDAZOLAM HCL 2 MG/2ML IJ SOLN
INTRAMUSCULAR | Status: AC
  Filled 2023-09-16: qty 2

## 2023-09-16 MED ORDER — REMIFENTANIL HCL 1 MG IV SOLR
INTRAVENOUS | Status: AC
Start: 2023-09-16 — End: 2023-09-16
  Filled 2023-09-16: qty 1000

## 2023-09-16 MED ORDER — REMIFENTANIL HCL 1 MG IV SOLR
INTRAVENOUS | Status: AC
  Filled 2023-09-16: qty 1000

## 2023-09-16 MED ORDER — CHLORHEXIDINE GLUCONATE 0.12 % MT SOLN
OROMUCOSAL | Status: AC
  Filled 2023-09-16: qty 15

## 2023-09-16 MED ORDER — FENTANYL CITRATE (PF) 100 MCG/2ML IJ SOLN
25.0000 ug | INTRAMUSCULAR | Status: DC | PRN
  Administered 2023-09-16 (×3): 50 ug via INTRAVENOUS
  Administered 2023-09-16 (×2): 25 ug via INTRAVENOUS

## 2023-09-16 MED ORDER — BUPIVACAINE HCL (PF) 0.5 % IJ SOLN
INTRAMUSCULAR | Status: AC
  Filled 2023-09-16: qty 30

## 2023-09-16 MED ORDER — PANTOPRAZOLE SODIUM 40 MG PO TBEC: 40.0000 mg | DELAYED_RELEASE_TABLET | Freq: Every day | ORAL | Status: DC | PRN

## 2023-09-16 MED ORDER — KETAMINE HCL 50 MG/5ML IJ SOSY
PREFILLED_SYRINGE | INTRAMUSCULAR | Status: DC | PRN
  Administered 2023-09-16: 10 mg via INTRAVENOUS
  Administered 2023-09-16: 40 mg via INTRAVENOUS

## 2023-09-16 MED ORDER — ORAL CARE MOUTH RINSE: 15.0000 mL | Freq: Once | OROMUCOSAL | Status: DC

## 2023-09-16 MED ORDER — EPHEDRINE 5 MG/ML INJ
INTRAVENOUS | Status: AC
  Filled 2023-09-16: qty 5

## 2023-09-16 MED ORDER — PHENYLEPHRINE 80 MCG/ML (10ML) SYRINGE FOR IV PUSH (FOR BLOOD PRESSURE SUPPORT)
PREFILLED_SYRINGE | INTRAVENOUS | Status: DC | PRN
  Administered 2023-09-16: 80 ug via INTRAVENOUS
  Administered 2023-09-16: 120 ug via INTRAVENOUS

## 2023-09-16 MED ORDER — KETAMINE HCL 50 MG/5ML IJ SOSY
PREFILLED_SYRINGE | INTRAMUSCULAR | Status: AC
  Filled 2023-09-16: qty 5

## 2023-09-16 MED ORDER — REMIFENTANIL HCL 1 MG IV SOLR
INTRAVENOUS | Status: DC | PRN
  Administered 2023-09-16: .15 ug/kg/min via INTRAVENOUS

## 2023-09-16 MED ORDER — PHENYLEPHRINE 80 MCG/ML (10ML) SYRINGE FOR IV PUSH (FOR BLOOD PRESSURE SUPPORT)
PREFILLED_SYRINGE | INTRAVENOUS | Status: AC
  Filled 2023-09-16: qty 10

## 2023-09-16 MED ORDER — VANCOMYCIN HCL IN DEXTROSE 1-5 GM/200ML-% IV SOLN
1000.0000 mg | Freq: Once | INTRAVENOUS | Status: AC
  Administered 2023-09-16: 1000 mg via INTRAVENOUS

## 2023-09-16 MED ORDER — METHYLPREDNISOLONE ACETATE 40 MG/ML IJ SUSP
INTRAMUSCULAR | Status: AC
  Filled 2023-09-16: qty 1

## 2023-09-16 MED ORDER — DOCUSATE SODIUM 100 MG PO CAPS
100.0000 mg | ORAL_CAPSULE | Freq: Two times a day (BID) | ORAL | Status: DC
  Administered 2023-09-16 – 2023-09-20 (×7): 100 mg via ORAL
  Filled 2023-09-16 (×8): qty 1

## 2023-09-16 MED ORDER — DROPERIDOL 2.5 MG/ML IJ SOLN: 0.6250 mg | Freq: Once | INTRAMUSCULAR | Status: DC | PRN

## 2023-09-16 MED ORDER — GABAPENTIN 300 MG PO CAPS
300.0000 mg | ORAL_CAPSULE | Freq: Once | ORAL | Status: AC
  Administered 2023-09-16: 300 mg via ORAL

## 2023-09-16 MED ORDER — DEXAMETHASONE SODIUM PHOSPHATE 10 MG/ML IJ SOLN
INTRAMUSCULAR | Status: AC
  Filled 2023-09-16: qty 1

## 2023-09-16 MED ORDER — CEFAZOLIN IN SODIUM CHLORIDE 3-0.9 GM/100ML-% IV SOLN
3.0000 g | Freq: Once | INTRAVENOUS | Status: AC
  Administered 2023-09-16 (×2): 3 g via INTRAVENOUS
  Filled 2023-09-16: qty 100

## 2023-09-16 MED ORDER — SORBITOL 70 % SOLN
30.0000 mL | Freq: Every day | Status: DC | PRN
  Filled 2023-09-16: qty 30

## 2023-09-16 MED ORDER — VANCOMYCIN HCL IN DEXTROSE 1-5 GM/200ML-% IV SOLN
INTRAVENOUS | Status: AC
  Filled 2023-09-16: qty 200

## 2023-09-16 MED ORDER — HYDROMORPHONE HCL 1 MG/ML IJ SOLN
INTRAMUSCULAR | Status: DC | PRN
  Administered 2023-09-16 (×2): .5 mg via INTRAVENOUS

## 2023-09-16 MED ORDER — PROPOFOL 10 MG/ML IV BOLUS
INTRAVENOUS | Status: DC | PRN
  Administered 2023-09-16: 150 mg via INTRAVENOUS
  Administered 2023-09-16: 50 mg via INTRAVENOUS

## 2023-09-16 MED ORDER — MONTELUKAST SODIUM 10 MG PO TABS
10.0000 mg | ORAL_TABLET | Freq: Every day | ORAL | Status: DC
  Administered 2023-09-16 – 2023-09-19 (×4): 10 mg via ORAL
  Filled 2023-09-16 (×4): qty 1

## 2023-09-16 MED ORDER — SUCCINYLCHOLINE CHLORIDE 200 MG/10ML IV SOSY
PREFILLED_SYRINGE | INTRAVENOUS | Status: AC
  Filled 2023-09-16: qty 10

## 2023-09-16 MED ORDER — ONDANSETRON HCL 4 MG PO TABS: 4.0000 mg | ORAL_TABLET | Freq: Four times a day (QID) | ORAL | Status: DC | PRN

## 2023-09-16 MED ORDER — ACETAMINOPHEN 500 MG PO TABS
1000.0000 mg | ORAL_TABLET | Freq: Once | ORAL | Status: AC
  Administered 2023-09-16: 1000 mg via ORAL

## 2023-09-16 MED ORDER — METHOCARBAMOL 500 MG PO TABS
500.0000 mg | ORAL_TABLET | Freq: Four times a day (QID) | ORAL | Status: DC | PRN
  Administered 2023-09-16 – 2023-09-17 (×2): 500 mg via ORAL
  Filled 2023-09-16 (×2): qty 1

## 2023-09-16 MED ORDER — METHOCARBAMOL 1000 MG/10ML IJ SOLN: 500.0000 mg | Freq: Four times a day (QID) | INTRAMUSCULAR | Status: DC | PRN

## 2023-09-16 MED ORDER — LORATADINE 10 MG PO TABS
10.0000 mg | ORAL_TABLET | Freq: Every day | ORAL | Status: DC
  Administered 2023-09-17 – 2023-09-20 (×4): 10 mg via ORAL
  Filled 2023-09-16 (×4): qty 1

## 2023-09-16 MED ORDER — GABAPENTIN 300 MG PO CAPS
ORAL_CAPSULE | ORAL | Status: AC
  Filled 2023-09-16: qty 1

## 2023-09-16 MED ORDER — ACETAMINOPHEN 650 MG RE SUPP: 650.0000 mg | RECTAL | Status: DC | PRN

## 2023-09-16 MED ORDER — ACETAMINOPHEN 10 MG/ML IV SOLN
1000.0000 mg | Freq: Once | INTRAVENOUS | Status: DC | PRN
  Administered 2023-09-16: 1000 mg via INTRAVENOUS

## 2023-09-16 MED ORDER — OXYCODONE HCL 5 MG PO TABS
5.0000 mg | ORAL_TABLET | Freq: Once | ORAL | Status: AC | PRN
  Administered 2023-09-16: 5 mg via ORAL

## 2023-09-16 MED ORDER — OXYCODONE HCL 5 MG PO TABS
ORAL_TABLET | ORAL | Status: AC
  Filled 2023-09-16: qty 1

## 2023-09-16 MED ORDER — POLYETHYLENE GLYCOL 3350 17 G PO PACK: 17.0000 g | PACK | Freq: Every day | ORAL | Status: DC | PRN

## 2023-09-16 MED ORDER — SODIUM CHLORIDE 0.9% FLUSH
3.0000 mL | Freq: Two times a day (BID) | INTRAVENOUS | Status: DC
  Administered 2023-09-16 – 2023-09-20 (×8): 3 mL via INTRAVENOUS

## 2023-09-16 MED ORDER — PROPOFOL 500 MG/50ML IV EMUL
INTRAVENOUS | Status: DC | PRN
Start: 2023-09-16 — End: 2023-09-16
  Administered 2023-09-16: 125 ug/kg/min via INTRAVENOUS

## 2023-09-16 MED ORDER — ACETAMINOPHEN 500 MG PO TABS
ORAL_TABLET | ORAL | Status: AC
  Filled 2023-09-16: qty 2

## 2023-09-16 MED ORDER — ONDANSETRON HCL 4 MG/2ML IJ SOLN: 4.0000 mg | Freq: Four times a day (QID) | INTRAMUSCULAR | Status: DC | PRN

## 2023-09-16 MED ORDER — SENNA 8.6 MG PO TABS
1.0000 | ORAL_TABLET | Freq: Two times a day (BID) | ORAL | Status: DC
  Administered 2023-09-16 – 2023-09-20 (×7): 8.6 mg via ORAL
  Filled 2023-09-16 (×9): qty 1

## 2023-09-16 MED ORDER — ONDANSETRON HCL 4 MG/2ML IJ SOLN
INTRAMUSCULAR | Status: AC
  Filled 2023-09-16: qty 2

## 2023-09-16 MED ORDER — ALBUTEROL SULFATE (2.5 MG/3ML) 0.083% IN NEBU: 3.0000 mL | INHALATION_SOLUTION | Freq: Four times a day (QID) | RESPIRATORY_TRACT | Status: DC | PRN

## 2023-09-16 MED ORDER — MENTHOL 3 MG MT LOZG
1.0000 | LOZENGE | OROMUCOSAL | Status: DC | PRN
  Filled 2023-09-16: qty 9

## 2023-09-16 MED ORDER — SODIUM CHLORIDE 0.9 % IV SOLN
250.0000 mL | INTRAVENOUS | Status: AC
  Administered 2023-09-16: 250 mL via INTRAVENOUS

## 2023-09-16 MED ORDER — SURGIFLO WITH THROMBIN (HEMOSTATIC MATRIX KIT) OPTIME
TOPICAL | Status: DC | PRN
  Administered 2023-09-16: 1 via TOPICAL

## 2023-09-16 MED ORDER — ENOXAPARIN SODIUM 40 MG/0.4ML IJ SOSY
40.0000 mg | PREFILLED_SYRINGE | INTRAMUSCULAR | Status: DC
  Administered 2023-09-17 – 2023-09-20 (×4): 40 mg via SUBCUTANEOUS
  Filled 2023-09-16 (×4): qty 0.4

## 2023-09-16 MED ORDER — ACETAMINOPHEN 10 MG/ML IV SOLN
INTRAVENOUS | Status: AC
  Filled 2023-09-16: qty 100

## 2023-09-16 MED ORDER — ACETAMINOPHEN 500 MG PO TABS
1000.0000 mg | ORAL_TABLET | Freq: Four times a day (QID) | ORAL | Status: DC
  Administered 2023-09-17 – 2023-09-18 (×5): 1000 mg via ORAL
  Filled 2023-09-16 (×5): qty 2

## 2023-09-16 SURGICAL SUPPLY — 30 items
BASIN KIT SINGLE STR (MISCELLANEOUS) ×1 IMPLANT
BUR NEURO DRILL SOFT 3.0X3.8M (BURR) ×1 IMPLANT
DERMABOND ADVANCED .7 DNX12 (GAUZE/BANDAGES/DRESSINGS) ×1 IMPLANT
DRAPE C ARM PK CFD 31 SPINE (DRAPES) ×1 IMPLANT
DRAPE LAPAROTOMY 100X77 ABD (DRAPES) ×1 IMPLANT
DRAPE MICROSCOPE SPINE 48X150 (DRAPES) ×1 IMPLANT
DRSG OPSITE POSTOP 3X4 (GAUZE/BANDAGES/DRESSINGS) ×1 IMPLANT
ELECTRODE EZSTD 165MM 6.5IN (MISCELLANEOUS) ×1 IMPLANT
ELECTRODE REM PT RTRN 9FT ADLT (ELECTROSURGICAL) ×1 IMPLANT
GAUZE 4X4 16PLY ~~LOC~~+RFID DBL (SPONGE) IMPLANT
GLOVE BIOGEL PI IND STRL 6.5 (GLOVE) ×1 IMPLANT
GLOVE SURG SYN 6.5 PF PI (GLOVE) ×1 IMPLANT
GLOVE SURG SYN 8.5 PF PI (GLOVE) ×3 IMPLANT
GOWN SRG LRG LVL 4 IMPRV REINF (GOWNS) ×1 IMPLANT
GOWN SRG XL LVL 3 NONREINFORCE (GOWNS) ×1 IMPLANT
KIT SPINAL PRONEVIEW (KITS) ×1 IMPLANT
MANIFOLD NEPTUNE II (INSTRUMENTS) ×1 IMPLANT
NDL SAFETY ECLIPSE 18X1.5 (NEEDLE) ×1 IMPLANT
NS IRRIG 500ML POUR BTL (IV SOLUTION) ×1 IMPLANT
PACK LAMINECTOMY ARMC (PACKS) ×1 IMPLANT
PAD ARMBOARD POSITIONER FOAM (MISCELLANEOUS) ×1 IMPLANT
SURGIFLO W/THROMBIN 8M KIT (HEMOSTASIS) ×1 IMPLANT
SUT ETHILON 3-0 (SUTURE) IMPLANT
SUT STRATA 3-0 15 PS-2 (SUTURE) ×1 IMPLANT
SUT VIC AB 0 CT1 27XCR 8 STRN (SUTURE) ×1 IMPLANT
SUT VIC AB 2-0 CT1 18 (SUTURE) ×1 IMPLANT
SUTURE EHLN 3-0 FS-10 30 BLK (SUTURE) IMPLANT
SYR 30ML LL (SYRINGE) ×2 IMPLANT
SYR 3ML LL SCALE MARK (SYRINGE) ×1 IMPLANT
TRAP FLUID SMOKE EVACUATOR (MISCELLANEOUS) ×1 IMPLANT

## 2023-09-16 NOTE — Progress Notes (Signed)
 Dr Francisca called and said to replace 16 french foley with 22 french foley 2 way due to his surgery 30 days ago. Replaced and now flow is coming out and patient feels better. No more pressure.

## 2023-09-16 NOTE — Anesthesia Procedure Notes (Signed)
 Procedure Name: Intubation Date/Time: 09/16/2023 2:22 PM  Performed by: Allean Greig SAUNDERS, CRNAPre-anesthesia Checklist: Patient identified, Emergency Drugs available, Suction available and Patient being monitored Patient Re-evaluated:Patient Re-evaluated prior to induction Oxygen Delivery Method: Circle system utilized Preoxygenation: Pre-oxygenation with 100% oxygen Induction Type: IV induction Ventilation: Mask ventilation without difficulty Laryngoscope Size: McGrath and 4 Grade View: Grade I Tube type: Oral Tube size: 7.5 mm Number of attempts: 1 Airway Equipment and Method: Stylet and Oral airway Placement Confirmation: ETT inserted through vocal cords under direct vision, positive ETCO2 and breath sounds checked- equal and bilateral Secured at: 24 cm Tube secured with: Tape Dental Injury: Teeth and Oropharynx as per pre-operative assessment

## 2023-09-16 NOTE — H&P (Signed)
 Follow-up note: Referring Physician:  No referring provider defined for this encounter.  Primary Physician:  System, Provider Not In  Chief Complaint: back and leg pain  History of Present Illness: 09/16/2023 Mr. Rockholt presents today with continued symptoms.   08/13/23 Aliene BROCKS Langenderfer Medford is a 63 year old male with non-specific interstitial lung disease who presents with worsening back and leg pain.  He experiences significant back pain radiating down his right leg into the buttock and occasionally to the back of the leg. The pain starts in the back, worsening throughout the day, beginning at a level of 1 in the morning and increasing to 4 or 5 by the end of the day. If he sleeps more than six hours, his back hurts upon waking. He has difficulty walking and cannot walk long distances without stopping due to pain and breathing difficulties. He can stand for about 15 to 20 minutes before the back pain becomes intolerable. He also experiences cramps but not pain on the front of his thigh, and denies any tingling or numbness in that area. He finds himself bending forward when walking and relies on shopping carts for support when in stores.  He had surgery last year and did not experience similar back pain prior to that. The pain began to worsen in August of last year. He had a PET scan done three to four months ago. He is currently on chronic steroids, having been on them for four months, off for a month and a half, and back on for three weeks. He also takes Eliquis following a DVT in January, which was likely triggered by a COVID infection. He is scheduled for a prostate procedure next week.  He reports breathing difficulties that have remained consistent since his last surgery a year ago. No pain on the front of his thigh but reports cramps. No tingling or numbness on the front of his thigh.  He has done physical therapy without improvement.   04/28/23 by Edsel Goods, PA-C Aliene BROCKS  Yo is a 63 y.o. male who presents with the chief complaint of ongoing pain despite L2-3 interlaminar ESI on 12/10/22.  Unfortunately this did not provide him with any significant relief. He would like to attempt on more injection. Today he describes low back pain that starts in the midline on the right and radiates out to his right flank as well as similar symptoms on the left that radiates down to his left buttock.  He has worsening numbness and tingling into his left foot.  He is currently taking tramadol  about once a day. Of note since his last visit, he had COVID and suffered a blood clot and is now on Eliquis which she is to remain on through June.  11/04/22 Mr. Mainer is a 63 year old presenting today via telephone visit for review of his MRI.  Overall his symptoms are unchanged from his last visit.   09/16/22 Aliene BROCKS Ferdinand is a 63 y.o. male who presents with the chief complaint of about 2 months of sharp left sided low back pain.  He states that this feels like the pain he had in his left side chronically and is slightly worse.  It starts in the mid to low back in the midline and radiates to the left without involvement into the buttock or down the leg.  He states that it is worse with activity and improved some with rest.  He did undergo treatment with a Medrol  Dosepak which did help for a  couple of weeks however his symptoms flared back up about 1 to 2 weeks ago.  He is currently taking diclofenac and Flexeril which provides some relief.   07/17/22 Glade Boys He was doing well at his last visit- he was cleared to go back to work as soon as he could tolerate the drive. He was to follow up with urology for some urinary issues that started prior to surgery.    Phone visit scheduled to check on his progress.   He is doing very well. He is back at work. He has minimal back soreness and aching when he is in the car a lot (has to drive far for work). He has relief with motrin and heat. He has no  pain the next day. He has intermittent pulling in his back. No leg pain.    He was diagnosed with cystitis and on antibiotics. No urinary issues since that time, but he is making a f/u with urology.   Review of Systems:  A 10 point review of systems is negative, and the pertinent positives and negatives detailed in the HPI.  Past Medical History: Past Medical History:  Diagnosis Date   Anemia 2014   excessive blood loss during cardiac ablation   Anxiety    Atrial fibrillation (HCC)    Complication of anesthesia    wakes up quickly from anesthesia; has awoken too early before   COPD (chronic obstructive pulmonary disease) (HCC)    Depression    Dyspnea    GERD (gastroesophageal reflux disease)    Hypertension    Hypertriglyceridemia    Hypothyroidism    ILD (interstitial lung disease) (HCC)    Morbid obesity with BMI of 40.0-44.9, adult (HCC)    Obesity    Pneumonia    RAD (reactive airway disease)    Rhinitis, allergic    Sarcoidosis, unspecified    Sleep apnea    uses C-Pap   Spinal stenosis, lumbar    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     Past Surgical History: Past Surgical History:  Procedure Laterality Date   AQUA ablation of prostate  2025   CARDIAC CATHETERIZATION  2014   CARDIAC CATHETERIZATION  2010   CARDIAC ELECTROPHYSIOLOGY STUDY AND ABLATION  2018   CARDIAC ELECTROPHYSIOLOGY STUDY AND ABLATION  2014   COLON FISTULA REPAIR  2016   COLONOSCOPY WITH PROPOFOL  N/A 02/11/2016   Procedure: COLONOSCOPY WITH PROPOFOL ;  Surgeon: Gladis RAYMOND Mariner, MD;  Location: [facility] END[facility]COPY;  Service: Endoscopy;  Laterality: N/A;   EXCISIONAL HEMORRHOIDECTOMY  2015   EYE SURGERY Left 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   KNEE ARTHROSCOPY Left 2012   KNEE ARTHROSCOPY Right 2014   LUMBAR LAMINECTOMY/DECOMPRESSION MICRODISCECTOMY N/A 04/28/2022   Procedure: L2-5 DECOMPRESSION;  Surgeon: Clois Fret, MD;  Location: [facility] ORS;  Service: Neurosurgery;  Laterality: N/A;    LUNG BIOPSY  04/2023   REPAIR OF CEREBROSPINAL FLUID LEAK  04/28/2022   Procedure: REPAIR OF CEREBROSPINAL FLUID LEAK;  Surgeon: Clois Fret, MD;  Location: [facility] ORS;  Service: Neurosurgery;;   RHINOPLASTY  2010   SHOULDER ARTHROSCOPY Right 2017    Allergies: Allergies as of 08/13/2023 - Review Complete 04/28/2023  Allergen Reaction Noted   Chlorhexidine  Dermatitis, Itching, Rash, and Swelling 11/24/2016   Adhesive [tape] Dermatitis 02/08/2016   Tetracyclines & related     Citalopram Palpitations 02/08/2016    Medications: Outpatient Encounter Medications as of 08/13/2023  Medication Sig   albuterol  (PROVENTIL  HFA;VENTOLIN  HFA) 108 (90 Base) MCG/ACT  inhaler Inhale 2 puffs into the lungs every 6 (six) hours as needed for wheezing or shortness of breath.   cetirizine (ZYRTEC) 10 MG chewable tablet Chew 10 mg by mouth at bedtime.   Cholecalciferol (D 1000) 25 MCG (1000 UT) capsule Take 1 capsule by mouth daily.   clobetasol  ointment (TEMOVATE ) 0.05 % APPLY TO AFFECTED AREA TWICE DAILY FOR UP TO 14 DAYS. MAY USE 2-3 TIMES A WEEK FOR MAINTENANCE   diltiazem  (CARDIZEM ) 30 MG tablet Take 30 mg by mouth every 6 (six) hours as needed.   ELIQUIS 5 MG TABS tablet Take 1 tablet twice a day by oral route for 90 days.   furosemide  (LASIX ) 20 MG tablet Take 20 mg by mouth as needed.    hydrOXYzine  (ATARAX ) 25 MG tablet Take 25 mg by mouth every 8 (eight) hours as needed.   LORazepam (ATIVAN) 2 MG tablet Take 2 mg by mouth every 6 (six) hours as needed for anxiety.   magnesium  oxide (MAG-OX) 400 MG tablet Take by mouth.   methocarbamol  (ROBAXIN ) 500 MG tablet Take 1 tablet (500 mg total) by mouth 4 (four) times daily.   mometasone (NASONEX) 50 MCG/ACT nasal spray Place into the nose.   montelukast  (SINGULAIR ) 10 MG tablet Take 10 mg by mouth at bedtime.   mupirocin ointment (BACTROBAN) 2 % Apply 1 Application topically 2 (two) times daily.   pantoprazole  (PROTONIX ) 40 MG tablet Take 40 mg by  mouth daily as needed.   predniSONE (DELTASONE) 20 MG tablet PLEASE SEE ATTACHED FOR DETAILED DIRECTIONS   SYNTHROID  137 MCG tablet Take 137 mcg by mouth at bedtime.   traMADol  (ULTRAM ) 50 MG tablet Take 1 tablet (50 mg total) by mouth 2 (two) times daily as needed.   No facility-administered encounter medications on file as of 08/13/2023.    Social History: Social History   Tobacco Use   Smoking status: Former   Smokeless tobacco: Former    Types: Chew    Quit date: 03/04/2022  Vaping Use   Vaping status: Never Used  Substance Use Topics   Alcohol use: Not Currently    Alcohol/week: 1.0 standard drink of alcohol    Types: 1 Cans of beer per week    Comment: occassional   Drug use: No    Family Medical History: Family History  Problem Relation Age of Onset   Cancer Father    Diabetes Sister    Diabetes Brother     Exam: Today's Vitals   09/16/23 1154  BP: (!) 157/79  Pulse: 62  Resp: 18  Temp: 98 F (36.7 C)  TempSrc: Temporal  SpO2: 100%  Weight: (!) 152 kg  Height: 6' 4 (1.93 m)  PainSc: 6     Body mass index is 40.78 kg/m.  Heart sounds normal no MRG. Chest Clear to Auscultation Bilaterally.  General: A&O.  ROM of spine: WNL.  Palpation of spine: TTP pf left low back.  Strength in the left lower extremity is EHL 5/5, Dorsiflexion 5/5, Plantar flexion 5/5, Hamstring 5/5, Quadricep 4+/5, Iliopsoas 4/5. Strength in the right lower extremity is EHL 5/5, Dorsiflexion 5/5, Plantar flexion 5/5, Hamstring 5/5, Quadricep 5/5, Iliopsoas 5/5. Reflexes are 1+ and symmetric at the patella and achilles.   Bilateral lower extremity sensation is intact to light touch.  Ambulates with an antalgic gait   Imaging: MRI L-spine 10/30/22 1.  Severe canal stenosis at L2-L3, secondary to broad-based disc bulge with superimposed left subarticular zone disc protrusion and ligamentum flavum  as well as facet hypertrophy. Associated crowding of the cauda equina nerve roots and complete  effacement of the CSF.  2.  Additional areas of lumbar spondylosis, with mild to moderate canal and foraminal stenosis further described above.  3.  Apparent right hemilaminotomy/laminectomy defects at L2-3 and L4-5, recommend correlation with surgical history.  4.  Diffuse heterogeneous marrow signal is nonspecific, but can be seen in individuals with chronic anemia, chronic hypoxemia (such as smoking) versus other marrow infiltrative/myeloproliferative disorders.  5.  Nonspecific feathery edema within the right paraspinous musculature spanning L3-L5. This could represent muscular strain or postoperative change. No fluid collection seen.   I have personally reviewed the images and agree with the above interpretation.  Assessment and Plan: Mr. Bergeson is a pleasant 63 y.o. male with ongoing low back and radiating leg pain despite injections and physical therapy.  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  He has a disc herniation at L2-3.  He has severe stenosis at L2-3 as well as moderate stenosis at L3-4 and right sided lateral recess stenosis at L4-5.  He has tried and failed conservative management.  I recommended lumbar decompression with a left-sided L2-3 microdiscectomy and L3-5 decompression.  He has failed a minimally invasive approach, so we will plan on an open approach.     [facility] Neurosurgery

## 2023-09-16 NOTE — Transfer of Care (Signed)
 Immediate Anesthesia Transfer of Care Note  Patient: Robert Green Bonito  Procedure(s) Performed: OPEN L2-3 MICRODISCECTOMY, L3-4 & L4-5 DECOMPRESSION  Patient Location: PACU  Anesthesia Type:General  Level of Consciousness: awake, alert , and oriented  Airway & Oxygen Therapy: Patient Spontanous Breathing and Patient connected to face mask oxygen  Post-op Assessment: Report given to RN, Post -op Vital signs reviewed and stable, and Patient moving all extremities  Post vital signs: Reviewed and stable  Last Vitals:  Vitals Value Taken Time  BP 139/95 09/16/23 19:15  Temp    Pulse 89 09/16/23 19:19  Resp 10 09/16/23 19:19  SpO2 100 % 09/16/23 19:19  Vitals shown include unfiled device data.  Last Pain:  Vitals:   09/16/23 1154  TempSrc: Temporal  PainSc: 6          Complications: There were no known notable events for this encounter.

## 2023-09-16 NOTE — Anesthesia Preprocedure Evaluation (Addendum)
 Anesthesia Evaluation  Patient identified by MRN, date of birth, ID band  Reviewed: Allergy & Precautions, NPO status , Patient's Chart, lab work & pertinent test results  History of Anesthesia Complications (+) history of anesthetic complications (wakes up quickly from anesthesia; has awoken too early before)  Airway Mallampati: II       Dental  (+) Dental Advidsory Given, Caps, Teeth Intact   Pulmonary neg shortness of breath, sleep apnea and Continuous Positive Airway Pressure Ventilation , neg recent URI, former smoker symptomatic ILD s/p wedge biopsy   Pulmonary exam normal        Cardiovascular Exercise Tolerance: Good hypertension, (-) angina + Peripheral Vascular Disease (venous insuffienciency)  (-) Past MI and (-) Cardiac Stents + dysrhythmias (ablation  for afib in 2019. LAST EKG with SB) (-) Valvular Problems/Murmurs Rhythm:Irregular + Peripheral Edema DVT in January 2025 treated with eliquis  ECHO 02/2023: SUMMARY  The left ventricular size is normal.  Mild left ventricular hypertrophy.  Left ventricular systolic function is normal.  LV ejection fraction = 55-60%.  Left ventricular filling pattern is prolonged relaxation.  The right ventricle is mildly dilated.  The right ventricular systolic function is normal.  The left atrium is mildly to moderately dilated.  There is mild tricuspid regurgitation.  Mild pulmonary hypertension.  IVC size was normal.  There is no pericardial effusion.  There is no comparison study available.     Neuro/Psych  PSYCHIATRIC DISORDERS Anxiety Depression     Neuromuscular disease    GI/Hepatic Neg liver ROS,GERD  ,,  Endo/Other  neg diabetesHypothyroidism  Class 3 obesity  Renal/GU negative Renal ROS     Musculoskeletal   Abdominal  (+) + obese  Peds  Hematology negative hematology ROS (+)   Anesthesia Other Findings Past Medical History: 2014: Anemia     Comment:  excessive blood loss during cardiac ablation No date: Anxiety No date: Atrial fibrillation (HCC) No date: Complication of anesthesia     Comment:  wakes up quickly from anesthesia; has awoken too early               before No date: COPD (chronic obstructive pulmonary disease) (HCC) No date: Depression No date: GERD (gastroesophageal reflux disease) No date: Hypertriglyceridemia No date: Hypothyroidism No date: Morbid obesity with BMI of 40.0-44.9, adult (HCC) No date: Obesity No date: Pneumonia No date: RAD (reactive airway disease) No date: Rhinitis, allergic No date: Sleep apnea     Comment:  uses C-Pap No date: Spinal stenosis, lumbar No date: 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   Reproductive/Obstetrics                              Anesthesia Physical Anesthesia Plan  ASA: 3  Anesthesia Plan: General   Post-op Pain Management: Tylenol  PO (pre-op)*, Gabapentin  PO (pre-op)* and Celebrex  PO (pre-op)*   Induction: Intravenous  PONV Risk Score and Plan: 2 and Ondansetron , Dexamethasone , Midazolam  and Treatment may vary due to age or medical condition  Airway Management Planned: Oral ETT  Additional Equipment:   Intra-op Plan:   Post-operative Plan: Extubation in OR  Informed Consent: I have reviewed the patients History and Physical, chart, labs and discussed the procedure including the risks, benefits and alternatives for the proposed anesthesia with the patient or authorized representative who has indicated his/her understanding and acceptance.       Plan Discussed with: CRNA  Anesthesia Plan Comments:  Anesthesia Quick Evaluation

## 2023-09-16 NOTE — Discharge Instructions (Addendum)
  Your surgeon has performed an operation on your lumbar spine (low back) to relieve pressure on one or more nerves. Many times, patients feel better immediately after surgery and can "overdo it." Even if you feel well, it is important that you follow these activity guidelines. If you do not let your back heal properly from the surgery, you can increase the chance of a disc herniation and/or return of your symptoms. The following are instructions to help in your recovery once you have been discharged from the hospital.  * It is ok to take NSAIDs after surgery. *Regarding compression stockings-  Please wear day and night until you are walking a couple hundred feet three times a day.   Activity    No bending, lifting, or twisting ("BLT"). Avoid lifting objects heavier than 10 pounds (gallon milk jug).  Where possible, avoid household activities that involve lifting, bending, pushing, or pulling such as laundry, vacuuming, grocery shopping, and childcare. Try to arrange for help from friends and family for these activities while your back heals.  Increase physical activity slowly as tolerated.  Taking short walks is encouraged, but avoid strenuous exercise. Do not jog, run, bicycle, lift weights, or participate in any other exercises unless specifically allowed by your doctor. Avoid prolonged sitting, including car rides.  Talk to your doctor before resuming sexual activity.  You should not drive until cleared by your doctor.  Until released by your doctor, you should not return to work or school.  You should rest at home and let your body heal.   You may shower three days after your surgery.  After showering, lightly dab your incision dry. Do not take a tub bath or go swimming for 3 weeks, or until approved by your doctor at your follow-up appointment.  If you smoke, we strongly recommend that you quit.  Smoking has been proven to interfere with normal healing in your back and will dramatically  reduce the success rate of your surgery. Please contact [facility] (800-QUIT-NOW) and use the resources at www.[facility].com for assistance in stopping smoking.  Surgical Incision   Ok to remove your dressing on 7/28 and leave incision open to air.  Diet            You may return to your usual diet. Be sure to stay hydrated.  When to Contact Us   Although your surgery and recovery will likely be uneventful, you may have some residual numbness, aches, and pains in your back and/or legs. This is normal and should improve in the next few weeks.  However, should you experience any of the following, contact us  immediately: New numbness or weakness Pain that is progressively getting worse, and is not relieved by your pain medications or rest Bleeding, redness, swelling, pain, or drainage from surgical incision Chills or flu-like symptoms Fever greater than 101.0 F (38.3 C) Problems with bowel or bladder functions Difficulty breathing or shortness of breath Warmth, tenderness, or swelling in your calf  Contact Information How to contact us :  If you have any questions/concerns before or after surgery, you can reach us  at [phone], or you can send a mychart message. We can be reached by phone or mychart 8am-4pm, Monday-Friday.  *Please note: Calls after 4pm are forwarded to a third party answering service. Mychart messages are not routinely monitored during evenings, weekends, and holidays. Please call our office to contact the answering service for urgent concerns during non-business hours.

## 2023-09-16 NOTE — Progress Notes (Signed)
 Dr Clois notified due to foley not working correctly. Twice. Order to irrigate but still did not make the flow come out. Pt said he still feels full. Bladder scanned patient fist time and there was 670. Did get about 300 out. Second scan there was 560 still and flow is  not coming out. Dr Clois is paging urology

## 2023-09-17 ENCOUNTER — Encounter: Payer: Self-pay | Admitting: Neurosurgery

## 2023-09-17 DIAGNOSIS — Z8616 Personal history of COVID-19: Secondary | ICD-10-CM | POA: Diagnosis not present

## 2023-09-17 DIAGNOSIS — Z881 Allergy status to other antibiotic agents status: Secondary | ICD-10-CM | POA: Diagnosis not present

## 2023-09-17 DIAGNOSIS — J849 Interstitial pulmonary disease, unspecified: Secondary | ICD-10-CM | POA: Diagnosis present

## 2023-09-17 DIAGNOSIS — Z7952 Long term (current) use of systemic steroids: Secondary | ICD-10-CM | POA: Diagnosis not present

## 2023-09-17 DIAGNOSIS — Z87891 Personal history of nicotine dependence: Secondary | ICD-10-CM | POA: Diagnosis not present

## 2023-09-17 DIAGNOSIS — E781 Pure hyperglyceridemia: Secondary | ICD-10-CM | POA: Diagnosis present

## 2023-09-17 DIAGNOSIS — Y848 Other medical procedures as the cause of abnormal reaction of the patient, or of later complication, without mention of misadventure at the time of the procedure: Secondary | ICD-10-CM | POA: Diagnosis not present

## 2023-09-17 DIAGNOSIS — T83098A Other mechanical complication of other indwelling urethral catheter, initial encounter: Secondary | ICD-10-CM

## 2023-09-17 DIAGNOSIS — Z833 Family history of diabetes mellitus: Secondary | ICD-10-CM | POA: Diagnosis not present

## 2023-09-17 DIAGNOSIS — Z91048 Other nonmedicinal substance allergy status: Secondary | ICD-10-CM | POA: Diagnosis not present

## 2023-09-17 DIAGNOSIS — Z888 Allergy status to other drugs, medicaments and biological substances status: Secondary | ICD-10-CM | POA: Diagnosis not present

## 2023-09-17 DIAGNOSIS — R319 Hematuria, unspecified: Secondary | ICD-10-CM

## 2023-09-17 DIAGNOSIS — M5416 Radiculopathy, lumbar region: Secondary | ICD-10-CM

## 2023-09-17 DIAGNOSIS — K219 Gastro-esophageal reflux disease without esophagitis: Secondary | ICD-10-CM | POA: Diagnosis present

## 2023-09-17 DIAGNOSIS — Z6841 Body Mass Index (BMI) 40.0 and over, adult: Secondary | ICD-10-CM | POA: Diagnosis not present

## 2023-09-17 DIAGNOSIS — M48062 Spinal stenosis, lumbar region with neurogenic claudication: Secondary | ICD-10-CM

## 2023-09-17 DIAGNOSIS — E66813 Obesity, class 3: Secondary | ICD-10-CM | POA: Diagnosis present

## 2023-09-17 DIAGNOSIS — Z79890 Hormone replacement therapy (postmenopausal): Secondary | ICD-10-CM | POA: Diagnosis not present

## 2023-09-17 DIAGNOSIS — Z7409 Other reduced mobility: Secondary | ICD-10-CM | POA: Diagnosis present

## 2023-09-17 DIAGNOSIS — I10 Essential (primary) hypertension: Secondary | ICD-10-CM | POA: Diagnosis present

## 2023-09-17 DIAGNOSIS — G9741 Accidental puncture or laceration of dura during a procedure: Secondary | ICD-10-CM | POA: Diagnosis not present

## 2023-09-17 DIAGNOSIS — Z86718 Personal history of other venous thrombosis and embolism: Secondary | ICD-10-CM | POA: Diagnosis not present

## 2023-09-17 DIAGNOSIS — E039 Hypothyroidism, unspecified: Secondary | ICD-10-CM | POA: Diagnosis present

## 2023-09-17 DIAGNOSIS — J4489 Other specified chronic obstructive pulmonary disease: Secondary | ICD-10-CM | POA: Diagnosis present

## 2023-09-17 DIAGNOSIS — M5116 Intervertebral disc disorders with radiculopathy, lumbar region: Secondary | ICD-10-CM | POA: Diagnosis present

## 2023-09-17 DIAGNOSIS — Z7901 Long term (current) use of anticoagulants: Secondary | ICD-10-CM | POA: Diagnosis not present

## 2023-09-17 MED ORDER — CLOBETASOL PROPIONATE 0.05 % EX OINT
1.0000 | TOPICAL_OINTMENT | Freq: Every day | CUTANEOUS | Status: DC | PRN
  Administered 2023-09-18: 1 via TOPICAL
  Filled 2023-09-17: qty 15

## 2023-09-17 MED ORDER — METHOCARBAMOL 500 MG PO TABS
750.0000 mg | ORAL_TABLET | Freq: Four times a day (QID) | ORAL | Status: DC | PRN
  Administered 2023-09-17 – 2023-09-20 (×8): 750 mg via ORAL
  Filled 2023-09-17 (×8): qty 2

## 2023-09-17 MED ORDER — METHOCARBAMOL 1000 MG/10ML IJ SOLN: 500.0000 mg | Freq: Four times a day (QID) | INTRAMUSCULAR | Status: DC | PRN

## 2023-09-17 MED ORDER — HYDROMORPHONE HCL 1 MG/ML IJ SOLN
0.5000 mg | INTRAMUSCULAR | Status: AC | PRN
  Administered 2023-09-17 – 2023-09-18 (×4): 0.5 mg via INTRAVENOUS
  Filled 2023-09-17 (×4): qty 0.5

## 2023-09-17 NOTE — Anesthesia Postprocedure Evaluation (Signed)
 Anesthesia Post Note  Patient: Robert Green  Procedure(s) Performed: OPEN L2-3 MICRODISCECTOMY, L3-4 & L4-5 DECOMPRESSION  Patient location during evaluation: PACU Anesthesia Type: General Level of consciousness: awake and alert Pain management: pain level controlled Vital Signs Assessment: post-procedure vital signs reviewed and stable Respiratory status: spontaneous breathing, nonlabored ventilation and respiratory function stable Cardiovascular status: blood pressure returned to baseline and stable Postop Assessment: no apparent nausea or vomiting Anesthetic complications: no   There were no known notable events for this encounter.   Last Vitals:  Vitals:   09/16/23 2045 09/16/23 2120  BP: (!) 149/73 126/68  Pulse: 65 (!) 58  Resp: (!) 21   Temp: 37.1 C 36.4 C  SpO2: 98% 99%    Last Pain:  Vitals:   09/17/23 0102  TempSrc:   PainSc: 6                  Fairy POUR [place]

## 2023-09-17 NOTE — Care Management Obs Status (Signed)
 MEDICARE OBSERVATION STATUS NOTIFICATION   Patient Details  Name: Robert Green MRN: [id number] Date of Birth: [birth year]   Medicare Observation Status Notification Given:  Yes    Dodge Ator W, CMA 09/17/2023, 11:31 AM

## 2023-09-17 NOTE — Progress Notes (Signed)
   Neurosurgery Progress Note  History: Robert Green is s/p L2-3 discectomy, L3-5 decompression  POD1: Pt doing well this morning depsite back pain and worsening numbness in the heel of his right foot. Denies any severe radicular pain or headache   Physical Exam: Vitals:   09/17/23 0016 09/17/23 0422  BP: 129/65 125/71  Pulse: (!) 56   Resp: 15 19  Temp: 97.8 F (36.6 C) 97.7 F (36.5 C)  SpO2: 98% 98%    AA Ox3 CNI  Strength:5/5 throughout BLE except 4+ in left HF and KE  Data:  Other tests/results: NA  Assessment/Plan:  Robert Green is a 63 y.o presenting with lumbar stenosis s/p L2-3 discectomy, L3-5 decompression  - mobilize - pain control - DVT prophylaxis - will continue foley for now - pt to remain flat until Friday morning (7/25)  Edsel Goods PA-C Department of Neurosurgery

## 2023-09-17 NOTE — Progress Notes (Signed)
 PT Cancellation Note  Patient Details Name: Robert Green MRN: [id number] DOB: 1960-07-05   Cancelled Treatment: PT received evaluation orders starting on 7/25. Reason Eval/Treat Not Completed: Active bedrest order, per activity order: pt to remain on flat bed rest until 0700 on 7/25. Pt may roll side to side for comfort or to sit up to 15 degrees for meals only. PT will proceed with evaluation starting 7/25.    Miran Kautzman 09/17/2023, 8:17 AM

## 2023-09-17 NOTE — Consult Note (Signed)
 Urology Consult   I have been asked to see the patient by Dr. Clois, for evaluation and management of hematuria, foley catheter not draining.  Chief Complaint: Hematuria, catheter not draining  HPI:  Robert Green is a 63 y.o. M s/p aquablation ~1 month ago at outside hospital who underwent neurosurgery procedure 7/23 and a 73F foley was placed. In PACU, concerns for hematuria and elevated PVR of despite foley in place, with difficulty irrigating. Foley was upsized to 24F 2-way and has been draining well since that time.  Urine is clear yellow.  He has done well after his aqua ablation, no significant incontinence or gross hematuria, voiding with a good stream prior to this procedure.  NSG planning to keep foley in place until Friday, as patient is on bedrest until that time.  PMH: Past Medical History:  Diagnosis Date   Anemia 2014   excessive blood loss during cardiac ablation   Anxiety    Atrial fibrillation (HCC)    Complication of anesthesia    wakes up quickly from anesthesia; has awoken too early before   COPD (chronic obstructive pulmonary disease) (HCC)    Depression    Dyspnea    GERD (gastroesophageal reflux disease)    Hypertension    Hypertriglyceridemia    Hypothyroidism    ILD (interstitial lung disease) (HCC)    Morbid obesity with BMI of 40.0-44.9, adult (HCC)    Obesity    Pneumonia    RAD (reactive airway disease)    Rhinitis, allergic    Sarcoidosis, unspecified    Sleep apnea    uses C-Pap   Spinal stenosis, lumbar    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     Surgical History: Past Surgical History:  Procedure Laterality Date   AQUA ablation of prostate  2025   CARDIAC CATHETERIZATION  2014   CARDIAC CATHETERIZATION  2010   CARDIAC ELECTROPHYSIOLOGY STUDY AND ABLATION  2018   CARDIAC ELECTROPHYSIOLOGY STUDY AND ABLATION  2014   COLON FISTULA REPAIR  2016   COLONOSCOPY WITH PROPOFOL  N/A 02/11/2016   Procedure:  COLONOSCOPY WITH PROPOFOL ;  Surgeon: Gladis RAYMOND Mariner, MD;  Location: [facility] ENDOSCOPY;  Service: Endoscopy;  Laterality: N/A;   EXCISIONAL HEMORRHOIDECTOMY  2015   EYE SURGERY Left 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   KNEE ARTHROSCOPY Left 2012   KNEE ARTHROSCOPY Right 2014   LUMBAR LAMINECTOMY/DECOMPRESSION MICRODISCECTOMY N/A 04/28/2022   Procedure: L2-5 DECOMPRESSION;  Surgeon: Clois Fret, MD;  Location: [facility] ORS;  Service: Neurosurgery;  Laterality: N/A;   LUMBAR LAMINECTOMY/DECOMPRESSION MICRODISCECTOMY N/A 09/16/2023   Procedure: OPEN L2-3 MICRODISCECTOMY, L3-4 & L4-5 DECOMPRESSION;  Surgeon: Clois Fret, MD;  Location: [facility] ORS;  Service: Neurosurgery;  Laterality: N/A;  OPEN L2-3 MICRODISCECTOMY, L3-4 & L4-5 DECOMPRESSION   LUNG BIOPSY  04/2023   REPAIR OF CEREBROSPINAL FLUID LEAK  04/28/2022   Procedure: REPAIR OF CEREBROSPINAL FLUID LEAK;  Surgeon: Clois Fret, MD;  Location: [facility] ORS;  Service: Neurosurgery;;   RHINOPLASTY  2010   SHOULDER ARTHROSCOPY Right 2017      Allergies:  Allergies  Allergen Reactions   Chlorhexidine  Dermatitis, Itching, Rash and Swelling    Pt develops skin rash   Adhesive [Tape] Dermatitis   Tetracyclines & Related    Citalopram Palpitations    Family History: Family History  Problem Relation Age of Onset   Cancer Father    Diabetes Sister    Diabetes Brother     Social History:  reports  that he has quit smoking. He quit smokeless tobacco use about 18 months ago.  His smokeless tobacco use included chew. He reports that he does not currently use alcohol after a past usage of about 1.0 standard drink of alcohol per week. He reports that he does not use drugs.  ROS: Negative aside from those stated in the HPI.  Physical Exam: BP 125/71 (BP Location: Right Arm)   Pulse (!) 56   Temp 97.7 F (36.5 C) (Oral)   Resp 19   Ht 6' 4 (1.93 m)   Wt (!) 152 kg   SpO2 98%   BMI 40.78 kg/m    Constitutional:  Alert and oriented, No  acute distress. Foley with clear yellow urine  Assessment & Plan:   63 year old male with recent aqua ablation 1 month ago at a hospital in [place]  who underwent a neurosurgery procedure yesterday and 16 Foley catheter developed gross hematuria and was not able to be irrigated.  This was upsized by nursing to a 22 [place] two-way Foley with immediate drainage of 500 mL yellow urine, urine has been clear yellow since that time and catheter draining well.  Recommendations: -Foley can be removed tomorrow as planned by neurosurgery team -Keep 1 month follow-up as scheduled with his outside urologist for follow-up  Redell JAYSON Burnet, MD   [facility] [address] [place], [place] [address] [phone]

## 2023-09-17 NOTE — Plan of Care (Signed)
  Problem: Education: Goal: Knowledge of General Education information will improve Description: Including pain rating scale, medication(s)/side effects and non-pharmacologic comfort measures Outcome: Progressing   Problem: Clinical Measurements: Goal: Diagnostic test results will improve Outcome: Progressing   Problem: Nutrition: Goal: Adequate nutrition will be maintained Outcome: Progressing   Problem: Pain Managment: Goal: General experience of comfort will improve and/or be controlled Outcome: Progressing   Problem: Pain Management: Goal: Pain level will decrease Outcome: Progressing

## 2023-09-17 NOTE — Op Note (Signed)
 Indications: Mr. 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 The patient tried and failed conservative management, prompting surgical intervention.  He previously underwent a minimally invasive decompression but had recurrent stenosis.  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   Preoperative Note:   Risks of surgery discussed include: infection, bleeding, stroke, coma, death, paralysis, CSF leak, nerve/spinal cord injury, numbness, tingling, weakness, complex regional pain syndrome, recurrent stenosis and/or disc herniation, vascular injury, development of instability, neck/back pain, need for further surgery, persistent symptoms, development of deformity, and the risks of anesthesia. The patient understood these risks and agreed to proceed.  Operative Note:  1. Open left L2/3 microdiscectomy 2. Open L3-5 laminectomy including medial facetectomies and foraminotomies    The patient was brought to the Operating Room, intubated and turned into the prone position. All pressure points were checked and double checked. The prior incision was identified. The patient was prepped and draped in the standard fashion. A full timeout was performed. Preoperative antibiotics were given. The incision was injected with local anesthetic.  The incision was opened with a scalpel, then the soft tissues divided with the Bovie. Self-retaining retractors were placed. The paraspinus muscles were reflected laterally in subperiosteal fashion until the facets were visible. Flouroscopy was used  to confirm our localization.   At this point, the microscope was brought into the field.  The Horsley rib cutter was used to remove the spinous processes of L3 and L4 4 as well as the inferior half of the spinous process of L2.  Using the high-speed drill, the lamina of L3 and L4 were carefully thinned.  The inferior half of L2 was also thinned.  After identifying the ligamentum flavum, the 3 mm punch was used to remove the remainder of the bone from the mid level of L2 to the top of L5.  There was some remaining ligamentum flavum, though there were also defects and extensive epidural scarring.  Starting at L2-3, the remaining ligamentum flavum was removed.  At L2-3, the lateral aspect of the dura on the left side was identified and protected.  The disc herniation was identified, dissected free, and removed.  There was still some medial facet overgrowth impacting the nerve roots.  This was carefully removed with the 2 and 3 mm Kerrison punch.  As such, decompression as well as discectomy was performed at L2-3.  We then moved to the L3-4 level.  While carefully protecting the dura, the medial facet was resected bilaterally to allow for complete decompression of the nerve roots and thecal sac.  During dissection L3-4, there was an area of dural thinning overlying the prior scar.  During dissection, small durotomy was made.  This was carefully protected.  We then moved to the L4-5 level.  Using the 2 and 3 mm punches, medial facetectomy and foraminotomies was performed as well as removal of the remaining ligamentum flavum until the dural sac and nerve roots were decompressed.  At this point, the L3, L4, and L5 nerve roots were fully decompressed as well as the central thecal sac.  After decompression was confirmed, we moved to repair of the durotomy.  The cottonoid  protecting the durotomy was removed.  The area was inspected and a small durotomy was noted.  This was carefully dissected and the nerve roots were  protected.  Using a single 5-0 Gore-Tex suture, the durotomy was repaired using muscle bolster.  A Valsalva was performed and showed no evidence of leakage.  Tisseel was then placed to augment the closure.   The operative site was extensively irrigated.  We then closed in layers in watertight fashion using 0 and 2-0 Vicryl and 3-0 nylon on the skin.  The patient was then flipped supine and extubated with incident. All counts were correct times 2 at the end of the case. No immediate complications were noted.  Edsel Goods PA assisted in the entire procedure. An assistant was required for this procedure due to the complexity.  The assistant provided assistance in tissue manipulation and suction, and was required for the successful and safe performance of the procedure. I performed the critical portions of the procedure.   Reeves Daisy MD

## 2023-09-17 NOTE — Plan of Care (Signed)
  Problem: Education: Goal: Knowledge of General Education information will improve Description: Including pain rating scale, medication(s)/side effects and non-pharmacologic comfort measures Outcome: Progressing   Problem: Clinical Measurements: Goal: Will remain free from infection Outcome: Progressing Goal: Respiratory complications will improve Outcome: Progressing   Problem: Nutrition: Goal: Adequate nutrition will be maintained Outcome: Progressing   Problem: Elimination: Goal: Will not experience complications related to bowel motility Outcome: Progressing   Problem: Skin Integrity: Goal: Risk for impaired skin integrity will decrease Outcome: Progressing   Problem: Education: Goal: Ability to verbalize activity precautions or restrictions will improve Outcome: Progressing Goal: Knowledge of the prescribed therapeutic regimen will improve Outcome: Progressing Goal: Understanding of discharge needs will improve Outcome: Progressing   Problem: Activity: Goal: Ability to avoid complications of mobility impairment will improve Outcome: Progressing Goal: Ability to tolerate increased activity will improve Outcome: Progressing Goal: Will remain free from falls Outcome: Progressing   Problem: Bowel/Gastric: Goal: Gastrointestinal status for postoperative course will improve Outcome: Progressing   Problem: Clinical Measurements: Goal: Ability to maintain clinical measurements within normal limits will improve Outcome: Progressing Goal: Postoperative complications will be avoided or minimized Outcome: Progressing Goal: Diagnostic test results will improve Outcome: Progressing   Problem: Skin Integrity: Goal: Will show signs of wound healing Outcome: Progressing   Problem: Health Behavior/Discharge Planning: Goal: Identification of resources available to assist in meeting health care needs will improve Outcome: Progressing   Problem: Health Behavior/Discharge  Planning: Goal: Ability to manage health-related needs will improve Outcome: Not Progressing   Problem: Activity: Goal: Risk for activity intolerance will decrease Outcome: Not Progressing   Problem: Coping: Goal: Level of anxiety will decrease Outcome: Not Progressing   Problem: Pain Managment: Goal: General experience of comfort will improve and/or be controlled Outcome: Not Progressing   Problem: Pain Management: Goal: Pain level will decrease Outcome: Not Progressing   Problem: Bladder/Genitourinary: Goal: Urinary functional status for postoperative course will improve Outcome: Not Progressing

## 2023-09-18 MED ORDER — OXYCODONE-ACETAMINOPHEN 5-325 MG PO TABS
1.0000 | ORAL_TABLET | ORAL | Status: DC | PRN
  Administered 2023-09-19: 1 via ORAL
  Filled 2023-09-18 (×3): qty 1

## 2023-09-18 MED ORDER — DIPHENHYDRAMINE HCL 25 MG PO CAPS
25.0000 mg | ORAL_CAPSULE | Freq: Four times a day (QID) | ORAL | Status: DC | PRN
  Administered 2023-09-18 – 2023-09-19 (×2): 25 mg via ORAL
  Filled 2023-09-18 (×2): qty 1

## 2023-09-18 MED ORDER — OXYBUTYNIN CHLORIDE 5 MG PO TABS
5.0000 mg | ORAL_TABLET | Freq: Three times a day (TID) | ORAL | Status: DC | PRN
  Administered 2023-09-18: 5 mg via ORAL
  Filled 2023-09-18 (×2): qty 1

## 2023-09-18 MED ORDER — OXYCODONE-ACETAMINOPHEN 5-325 MG PO TABS
2.0000 | ORAL_TABLET | ORAL | Status: DC | PRN
  Administered 2023-09-18 – 2023-09-20 (×11): 2 via ORAL
  Filled 2023-09-18 (×10): qty 2

## 2023-09-18 MED ORDER — ZOLPIDEM TARTRATE 5 MG PO TABS
5.0000 mg | ORAL_TABLET | Freq: Every evening | ORAL | Status: DC | PRN
  Administered 2023-09-18: 10 mg via ORAL
  Filled 2023-09-18: qty 2

## 2023-09-18 NOTE — Evaluation (Addendum)
 Occupational Therapy Evaluation Patient Details Name: Robert Green MRN: [id number] DOB: November 04, 1960 Today's Date: 09/18/2023   History of Present Illness   Pt is a 63 year old male s/p L2-3 discectomy, L3-5 decompression 09/16/23     Clinical Impressions Chart reviewed to date, pt greeted semi supine in bed, agreeable to OT evaluation. PTA pt reports he is MOD I-I in ADL/IADL. Pt presents with deficits in activity tolerance, balance, endurance affecting safe and optimal ADL completion. Pt educated in precautions, self care skills, AE, and home/routines modifications to maximize safety and functional independence while minimizing falls risk and maintaining precautions. Pt verbalized understanding of all education/training provided. Able to return demonstration safe techniques while maintaining precautions throughout. Handout provided to support recall and carry over of learned precautions/techniques for bed mobility, functional transfers, and self care skills. CGA required for bed mobility via log roll, STS with CGA- MIN A (multiple attempts), amb to bathroom with RW with CGA. Toileting completed with urinal (bsc not comfortable to urinate per pt) with CGA. Urine appears red tinged, notified nurse.Pt is performing ADL/functional mobility below PLOF, will benefit from acute OT to address deficits. Pt is left in chair, all needs met. OT will continue to follow acutely to facilitate optimal ADL/functional mobility performance.       If plan is discharge home, recommend the following:   A little help with bathing/dressing/bathroom;A little help with walking and/or transfers;Help with stairs or ramp for entrance;Assistance with cooking/housework;Assist for transportation     Functional Status Assessment   Patient has had a recent decline in their functional status and demonstrates the ability to make significant improvements in function in a reasonable and predictable amount of time.      Equipment Recommendations   None recommended by OT;Other (comment) (pt has recommended equipment)     Recommendations for Other Services         Precautions/Restrictions   Precautions Precautions: Fall;Back Precaution Booklet Issued: Yes (comment) Recall of Precautions/Restrictions: Intact Precaution/Restrictions Comments: no brace needed Restrictions Weight Bearing Restrictions Per Provider Order: No     Mobility Bed Mobility Overal bed mobility: Needs Assistance Bed Mobility: Rolling, Sidelying to Sit Rolling: Contact guard assist Sidelying to sit: Contact guard assist, Min assist       General bed mobility comments: intermittent vcs for technique    Transfers Overall transfer level: Needs assistance Equipment used: Rolling walker (2 wheels) Transfers: Sit to/from Stand Sit to Stand: Contact guard assist, Min assist           General transfer comment: intermittent vcs for technique/hand placement      Balance Overall balance assessment: Needs assistance Sitting-balance support: Feet supported Sitting balance-Leahy Scale: Good     Standing balance support: Bilateral upper extremity supported, During functional activity, Reliant on assistive device for balance Standing balance-Leahy Scale: Fair                             ADL either performed or assessed with clinical judgement   ADL Overall ADL's : Needs assistance/impaired Eating/Feeding: Set up   Grooming: Supervision/safety;Standing;Wash/dry hands Grooming Details (indicate cue type and reason): RW at sink level             Lower Body Dressing: Maximal assistance;Sitting/lateral leans Lower Body Dressing Details (indicate cue type and reason): donn socks and shorts; reports his wife will help him after; edu re use of AE as pt is unable to perform figure 4 Toilet  Transfer: Contact guard assist;Rolling walker (2 wheels);Ambulation;BSC/3in1 Statistician Details (indicate  cue type and reason): intermittent vcs for technique Toileting- Clothing Manipulation and Hygiene: Contact guard assist;Sit to/from stand Toileting - Clothing Manipulation Details (indicate cue type and reason): urinate in urinal     Functional mobility during ADLs: Contact guard assist;Rolling walker (2 wheels) (approx 15' two attempts to and from bathroom, intermittent vcs for technique)       Vision Patient Visual Report: No change from baseline       Perception         Praxis         Pertinent Vitals/Pain Pain Assessment Pain Assessment: 0-10 Pain Score: 8  Pain Location: lower back Pain Descriptors / Indicators: Discomfort, Cramping, Spasm (notified nurse, pain meds due at 10) Pain Intervention(s): Monitored during session, Limited activity within patient's tolerance, Repositioned     Extremity/Trunk Assessment Upper Extremity Assessment Upper Extremity Assessment: Overall WFL for tasks assessed   Lower Extremity Assessment Lower Extremity Assessment: Defer to PT evaluation; tingling in R heel        Communication Communication Communication: No apparent difficulties   Cognition Arousal: Alert Behavior During Therapy: WFL for tasks assessed/performed Cognition: No apparent impairments                               Following commands: Intact       Cueing  General Comments   Cueing Techniques: Verbal cues   Incision appears intact under honeycomb dressing, no clear drainage noted, no HA noted throughout    Exercises     Shoulder Instructions      Home Living Family/patient expects to be discharged to:: Private residence Living Arrangements: Spouse/significant other Available Help at Discharge: Available 24 hours/day;Family Type of Home: House Home Access: Stairs to enter [employer] of Steps: 2 Entrance Stairs-Rails: None Home Layout: One level     Bathroom Shower/Tub: Producer, television/film/video: Handicapped  height Bathroom Accessibility: Yes   Home Equipment: Agricultural consultant (2 wheels);Shower seat;Hand held shower head   Additional Comments: Human resources officer      Prior Functioning/Environment Prior Level of Function : Independent/Modified Independent                    OT Problem List: Decreased activity tolerance;Decreased knowledge of use of DME or AE;Impaired balance (sitting and/or standing)   OT Treatment/Interventions: Self-care/ADL training;Therapeutic exercise;Patient/family education;Balance training;Energy conservation;Therapeutic activities;DME and/or AE instruction      OT Goals(Current goals can be found in the care plan section)   Acute Rehab OT Goals Patient Stated Goal: improve function OT Goal Formulation: With patient Time For Goal Achievement: 10/02/23 Potential to Achieve Goals: Good ADL Goals Pt Will Perform Grooming: with modified independence;sitting;standing Pt Will Perform Lower Body Dressing: with modified independence;sitting/lateral leans;sit to/from stand Pt Will Transfer to Toilet: with modified independence;ambulating Pt Will Perform Toileting - Clothing Manipulation and hygiene: with modified independence;sit to/from stand;sitting/lateral leans   OT Frequency:  Min 3X/week    Co-evaluation              AM-PAC OT 6 Clicks Daily Activity     Outcome Measure Help from another person eating meals?: None Help from another person taking care of personal grooming?: None Help from another person toileting, which includes using toliet, bedpan, or urinal?: A Little Help from another person bathing (including washing, rinsing, drying)?: A Little Help from another person to  put on and taking off regular upper body clothing?: None Help from another person to put on and taking off regular lower body clothing?: A Lot 6 Click Score: 20   End of Session Equipment Utilized During Treatment: Rolling walker (2 wheels);Gait belt Nurse  Communication: Mobility status  Activity Tolerance: Patient tolerated treatment well Patient left: in chair;with call bell/phone within reach  OT Visit Diagnosis: Other abnormalities of gait and mobility (R26.89)                Time: 9143-9061 OT Time Calculation (min): 42 min Charges:  OT General Charges $OT Visit: 1 Visit OT Evaluation $OT Eval Moderate Complexity: 1 Mod  Therisa Sheffield, OTD OTR/L  09/18/23, 9:54 AM

## 2023-09-18 NOTE — Plan of Care (Signed)

## 2023-09-18 NOTE — Progress Notes (Signed)
 Physical Therapy Treatment Patient Details Name: Robert Green MRN: [id number] DOB: [birth year] Today's Date: 09/18/2023   History of Present Illness Pt is a 63 y.o. male s/p L2/3 discectomy and L3-5 decompression 09/16/23 d/t worsening back and leg pain.  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    PT Comments  Pt was sitting in recliner with supportive spouse at bedside, upon arrival. He endorses severe dizziness/whoozy/lightheadedness. BP 122/69(85). Due to severity of symptoms and lack of pt sleeping in past 24 hours, elected not to progress pt further than returning to bed. PT was able to stand and take several steps to EOB with little to no assistance. He did well however require assistance to progress BLEs into bed from EOB.  Ill do better tomorrow after I get a little sleep. Both pt and spouse are still planning to DC directly home once stable. BP at conclusion of session 112/50 (66). Acute PT will continue to follow per current POC.   If plan is discharge home, recommend the following: A little help with walking and/or transfers;A little help with bathing/dressing/bathroom;Assistance with cooking/housework;Assist for transportation;Help with stairs or ramp for entrance     Equipment Recommendations  Rolling walker (2 wheels);BSC/3in1       Precautions / Restrictions Precautions Precautions: Fall;Back Precaution Booklet Issued: Yes (comment) Recall of Precautions/Restrictions: Intact Precaution/Restrictions Comments: no brace needed Restrictions Weight Bearing Restrictions Per Provider Order: No     Mobility  Bed Mobility Overal bed mobility: Needs Assistance Bed Mobility: Sit to Supine, Sit to Sidelying Rolling: Contact guard assist  Sit to sidelying: Min assist General bed mobility comments: pt required assistance to progress form EOB to sidelying in  bed. once in side lying was able to roll to supine(HOB elevated slightly)  does require assistance to reposition to [facility] form FOB    Transfers Overall transfer level: Needs assistance Equipment used: Rolling walker (2 wheels) Transfers: Sit to/from Stand Sit to Stand: Contact guard assist  General transfer comment: CGA for safety. no physical lifting assistnace to achieve standing    Ambulation/Gait Ambulation/Gait assistance: Supervision Gait Distance (Feet): 3 Feet Assistive device: Rolling walker (2 wheels) Gait Pattern/deviations: Step-to pattern Gait velocity: decreased  General Gait Details: distance limited by pt's c/o severe dizziness/lightheadedness. BP 122/69 (85), BP after session 112/50(66)     Balance Overall balance assessment: Needs assistance Sitting-balance support: No upper extremity supported, Feet supported Sitting balance-Leahy Scale: Good     Standing balance support: Bilateral upper extremity supported, During functional activity, Reliant on assistive device for balance Standing balance-Leahy Scale: Fair Standing balance comment: steady walking with RW use     Communication Communication Communication: No apparent difficulties  Cognition Arousal: Alert Behavior During Therapy: WFL for tasks assessed/performed   PT - Cognitive impairments: No apparent impairments      PT - Cognition Comments: Pt is A and O x 4. very pleasnat but pain limited. Supportive spouse present and endorses that pt has not sleep in over 24 hours. MD ordered nightime meds to aide with sleep. Pt's BP 122/69(85) Following commands: Intact            General Comments General comments (skin integrity, edema, etc.): mild drainage noted back/surgical dressing (no change beginning/end of session)      Pertinent Vitals/Pain Pain Assessment Pain Assessment: 0-10 Pain Score: 8  Pain Location: lower back Pain Descriptors / Indicators: Discomfort, Aching Pain Intervention(s):  Limited activity within patient's tolerance, Monitored during session, RN gave pain meds during session, Repositioned    Home Living Family/patient expects to be discharged to:: Private residence Living Arrangements: Spouse/significant other Available Help at Discharge: Available 24 hours/day;Family Type of Home: House Home Access: Stairs to enter Entrance Stairs-Rails: None Entrance Stairs-Number of Steps: 2   Home Layout: One level Home Equipment: Agricultural consultant (2 wheels);Shower seat;Hand held shower head Additional Comments: Human resources officer        PT Goals (current goals can now be found in the care plan section) Acute Rehab PT Goals Patient Stated Goal: to improve strength, pain, and walking PT Goal Formulation: With patient Time For Goal Achievement: 10/02/23 Potential to Achieve Goals: Good Progress towards PT goals: Progressing toward goals    Frequency    BID       AM-PAC PT 6 Clicks Mobility   Outcome Measure  Help needed turning from your back to your side while in a flat bed without using bedrails?: A Little Help needed moving from lying on your back to sitting on the side of a flat bed without using bedrails?: A Little Help needed moving to and from a bed to a chair (including a wheelchair)?: A Little Help needed standing up from a chair using your arms (e.g., wheelchair or bedside chair)?: A Little Help needed to walk in hospital room?: A Little Help needed climbing 3-5 steps with a railing? : A Lot 6 Click Score: 17    End of Session Equipment Utilized During Treatment: Gait belt (belt up high away from surgical site) Activity Tolerance: Patient tolerated treatment well Patient left: in bed;with call bell/phone within reach;with bed alarm set;with family/visitor present Nurse Communication: Mobility status;Precautions;Other (comment) PT Visit Diagnosis: Other abnormalities of gait and mobility (R26.89);Muscle weakness (generalized)  (M62.81);Pain Pain - Right/Left:  (low back) Pain - part of body:  (low back)     Time: 8569-8557 PT Time Calculation (min) (ACUTE ONLY): 12 min  Charges:    $Gait Training: 8-22 mins $Therapeutic Activity: 8-22 mins PT General Charges $$ ACUTE PT VISIT: 1 Visit                    Rankin Essex PTA 09/18/23, 4:39 PM

## 2023-09-18 NOTE — Evaluation (Signed)
 Physical Therapy Evaluation Patient Details Name: Robert Green MRN: [id number] DOB: 08/07/1960 Today's Date: 09/18/2023  History of Present Illness  Pt is a 63 y.o. male s/p L2/3 discectomy and L3-5 decompression 09/16/23 d/t worsening back and leg pain.  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  Clinical Impression  Prior to surgery, pt reports being ambulatory (no AD use) but limited d/t pain/impairments; lives with his wife in 1 level home with 2 STE no railing.  Pt pre-medicated with pain medication for PT session.  LBP 6/10 at rest beginning of session and 5/10 at rest end of session.  Currently pt is min assist for transfer from recliner (vc's for technique) and CGA to ambulate 90 feet with RW use.  Pt demonstrates short step length/reduced foot clearance (d/t increased step length causing increased LBP).  Pt assisted back to bed end of session and repositioned to improve comfort.  Pt educated on spinal precautions: pt and pt's wife verbalizing understanding.  Pt would currently benefit from skilled PT to address noted impairments and functional limitations (see below for any additional details).  Upon hospital discharge, pt would benefit from ongoing therapy.     If plan is discharge home, recommend the following: A little help with walking and/or transfers;A little help with bathing/dressing/bathroom;Assistance with cooking/housework;Assist for transportation;Help with stairs or ramp for entrance   Can travel by private vehicle        Equipment Recommendations Rolling walker (2 wheels);BSC/3in1  Recommendations for Other Services       Functional Status Assessment Patient has had a recent decline in their functional status and demonstrates the ability to make significant improvements in function in a reasonable and predictable amount of time.     Precautions  / Restrictions Precautions Precautions: Fall;Back Precaution Booklet Issued: Yes (comment) Recall of Precautions/Restrictions: Intact Precaution/Restrictions Comments: no brace needed Restrictions Weight Bearing Restrictions Per Provider Order: No      Mobility  Bed Mobility Overal bed mobility: Needs Assistance Bed Mobility: Sit to Sidelying, Rolling Rolling: Contact guard assist       Sit to sidelying: Mod assist, +2 for physical assistance (assist for trunk and B LE's) General bed mobility comments: vc's for logrolling technique/spinal precautions    Transfers Overall transfer level: Needs assistance Equipment used: Rolling walker (2 wheels) Transfers: Sit to/from Stand Sit to Stand: Min assist           General transfer comment: vc's for UE placement; assist to come to full stand; vc's for overall technique (pt attempting to stand straight up; vc's for forward flexion at hip and transverse abdominal activation during transfers)    Ambulation/Gait Ambulation/Gait assistance: Contact guard assist Gait Distance (Feet): 90 Feet Assistive device: Rolling walker (2 wheels)   Gait velocity: decreased     General Gait Details: short steps with decreased B LE foot clearance/step length  Stairs            Wheelchair Mobility     Tilt Bed    Modified Rankin (Stroke Patients Only)       Balance Overall balance assessment: Needs assistance Sitting-balance support: No upper extremity supported, Feet supported Sitting balance-Leahy Scale: Good Sitting balance - Comments: steady reaching within BOS   Standing balance support: Bilateral upper extremity supported, During functional activity, Reliant on assistive device for balance Standing balance-Leahy Scale: Fair Standing balance comment: steady walking with RW use  Pertinent Vitals/Pain Pain Assessment Pain Assessment: 0-10 Pain Score: 5  Pain Location: lower  back Pain Descriptors / Indicators: Discomfort, Aching Pain Intervention(s): Limited activity within patient's tolerance, Monitored during session, Premedicated before session, Repositioned Pre-ambulation pt's BP 129/74 with HR 81 bpm; post ambulation pt's BP 132/65 with HR 80 bpm.    Home Living Family/patient expects to be discharged to:: Private residence Living Arrangements: Spouse/significant other Available Help at Discharge: Available 24 hours/day;Family Type of Home: House Home Access: Stairs to enter Entrance Stairs-Rails: None Entrance Stairs-Number of Steps: 2   Home Layout: One level Home Equipment: Agricultural consultant (2 wheels);Shower seat;Hand held shower head Additional Comments: Human resources officer    Prior Function Prior Level of Function : Independent/Modified Independent                     Extremity/Trunk Assessment   Upper Extremity Assessment Upper Extremity Assessment: Overall WFL for tasks assessed    Lower Extremity Assessment Lower Extremity Assessment: Generalized weakness Pt reporting numbness R foot improving (had prior to surgery).    Cervical / Trunk Assessment Cervical / Trunk Assessment: Back Surgery  Communication   Communication Communication: No apparent difficulties    Cognition Arousal: Alert Behavior During Therapy: WFL for tasks assessed/performed   PT - Cognitive impairments: No apparent impairments                         Following commands: Intact       Cueing Cueing Techniques: Verbal cues     General Comments General comments (skin integrity, edema, etc.): mild drainage noted back/surgical dressing (no change beginning/end of session).  Nursing cleared pt for participation in physical therapy.  Pt agreeable to PT session.  Pt's wife present during session.    Exercises     Assessment/Plan    PT Assessment Patient needs continued PT services  PT Problem List Decreased strength;Decreased activity  tolerance;Decreased balance;Decreased mobility;Decreased knowledge of precautions;Decreased skin integrity;Pain       PT Treatment Interventions DME instruction;Gait training;Stair training;Functional mobility training;Therapeutic activities;Therapeutic exercise;Balance training;Patient/family education    PT Goals (Current goals can be found in the Care Plan section)  Acute Rehab PT Goals Patient Stated Goal: to improve strength, pain, and walking PT Goal Formulation: With patient Time For Goal Achievement: 10/02/23 Potential to Achieve Goals: Good    Frequency BID     Co-evaluation               AM-PAC PT 6 Clicks Mobility  Outcome Measure Help needed turning from your back to your side while in a flat bed without using bedrails?: A Little Help needed moving from lying on your back to sitting on the side of a flat bed without using bedrails?: A Lot Help needed moving to and from a bed to a chair (including a wheelchair)?: A Little Help needed standing up from a chair using your arms (e.g., wheelchair or bedside chair)?: A Little Help needed to walk in hospital room?: A Little Help needed climbing 3-5 steps with a railing? : A Lot 6 Click Score: 16    End of Session Equipment Utilized During Treatment: Gait belt (belt up high away from surgical site) Activity Tolerance: Patient tolerated treatment well;No increased pain Patient left: in bed;with call bell/phone within reach;with bed alarm set;with family/visitor present Nurse Communication: Mobility status;Precautions;Other (comment) (Pt's pain status) PT Visit Diagnosis: Other abnormalities of gait and mobility (R26.89);Muscle weakness (generalized) (M62.81);Pain Pain - Right/Left:  (  low back) Pain - part of body:  (low back)    Time: 8952-8867 PT Time Calculation (min) (ACUTE ONLY): 45 min   Charges:   PT Evaluation $PT Eval Low Complexity: 1 Low PT Treatments $Gait Training: 8-22 mins $Therapeutic Activity:  8-22 mins PT General Charges $$ ACUTE PT VISIT: 1 Visit        Damien Caulk, PT 09/18/23, 2:27 PM

## 2023-09-18 NOTE — Progress Notes (Signed)
   Neurosurgery Progress Note  History: Robert Green is s/p L2-3 discectomy, L3-5 decompression  POD2: doing better this morning. Reports improved numbness in his foot. POD1: Pt doing well this morning depsite back pain and worsening numbness in the heel of his right foot. Denies any severe radicular pain or headache   Physical Exam: Vitals:   09/17/23 2027 09/18/23 0339  BP: (!) 111/58 124/71  Pulse: 70 (!) 59  Resp: 18 16  Temp: 98.2 F (36.8 C) 97.8 F (36.6 C)  SpO2: 100% 100%    AA Ox3 CNI  Strength:5/5 throughout BLE except 4+ in left HF and KE  Data:  Other tests/results: NA  Assessment/Plan:  Robert Green is a 63 y.o presenting with lumbar stenosis s/p L2-3 discectomy, L3-5 decompression  - mobilize - pain control - DVT prophylaxis - ok to d/c foley this morning - ok for patient to start activity. PT and OT consulted. - watch incision for clear drainage.  Edsel Goods PA-C Department of Neurosurgery

## 2023-09-19 NOTE — Plan of Care (Signed)
 Problem: Education: Goal: Knowledge of General Education information will improve Description: Including pain rating scale, medication(s)/side effects and non-pharmacologic comfort measures 09/19/2023 1830 by Isiah Verle SQUIBB, RN Outcome: Progressing 09/19/2023 1706 by Isiah Verle SQUIBB, RN Outcome: Progressing   Problem: Health Behavior/Discharge Planning: Goal: Ability to manage health-related needs will improve 09/19/2023 1830 by Isiah Verle SQUIBB, RN Outcome: Progressing 09/19/2023 1706 by Isiah Verle SQUIBB, RN Outcome: Progressing   Problem: Clinical Measurements: Goal: Ability to maintain clinical measurements within normal limits will improve 09/19/2023 1830 by Isiah Verle SQUIBB, RN Outcome: Progressing 09/19/2023 1706 by Isiah Verle SQUIBB, RN Outcome: Progressing Goal: Will remain free from infection 09/19/2023 1830 by Isiah Verle SQUIBB, RN Outcome: Progressing 09/19/2023 1706 by Isiah Verle SQUIBB, RN Outcome: Progressing Goal: Diagnostic test results will improve 09/19/2023 1830 by Isiah Verle SQUIBB, RN Outcome: Progressing 09/19/2023 1706 by Isiah Verle SQUIBB, RN Outcome: Progressing Goal: Respiratory complications will improve 09/19/2023 1830 by Isiah Verle SQUIBB, RN Outcome: Progressing 09/19/2023 1706 by Isiah Verle SQUIBB, RN Outcome: Progressing Goal: Cardiovascular complication will be avoided 09/19/2023 1830 by Isiah Verle SQUIBB, RN Outcome: Progressing 09/19/2023 1706 by Isiah Verle SQUIBB, RN Outcome: Progressing   Problem: Activity: Goal: Risk for activity intolerance will decrease 09/19/2023 1830 by Isiah Verle SQUIBB, RN Outcome: Progressing 09/19/2023 1706 by Isiah Verle SQUIBB, RN Outcome: Progressing   Problem: Nutrition: Goal: Adequate nutrition will be maintained 09/19/2023 1830 by Isiah Verle SQUIBB, RN Outcome: Progressing 09/19/2023 1706 by Isiah Verle SQUIBB, RN Outcome: Progressing   Problem: Coping: Goal: Level  of anxiety will decrease 09/19/2023 1830 by Isiah Verle SQUIBB, RN Outcome: Progressing 09/19/2023 1706 by Isiah Verle SQUIBB, RN Outcome: Progressing   Problem: Elimination: Goal: Will not experience complications related to bowel motility 09/19/2023 1830 by Isiah Verle SQUIBB, RN Outcome: Progressing 09/19/2023 1706 by Isiah Verle SQUIBB, RN Outcome: Progressing Goal: Will not experience complications related to urinary retention 09/19/2023 1830 by Isiah Verle SQUIBB, RN Outcome: Progressing 09/19/2023 1706 by Isiah Verle SQUIBB, RN Outcome: Progressing   Problem: Pain Managment: Goal: General experience of comfort will improve and/or be controlled 09/19/2023 1830 by Isiah Verle SQUIBB, RN Outcome: Progressing 09/19/2023 1706 by Isiah Verle SQUIBB, RN Outcome: Progressing   Problem: Safety: Goal: Ability to remain free from injury will improve 09/19/2023 1830 by Isiah Verle SQUIBB, RN Outcome: Progressing 09/19/2023 1706 by Isiah Verle SQUIBB, RN Outcome: Progressing   Problem: Skin Integrity: Goal: Risk for impaired skin integrity will decrease 09/19/2023 1830 by Isiah Verle SQUIBB, RN Outcome: Progressing 09/19/2023 1706 by Isiah Verle SQUIBB, RN Outcome: Progressing   Problem: Education: Goal: Ability to verbalize activity precautions or restrictions will improve 09/19/2023 1830 by Isiah Verle SQUIBB, RN Outcome: Progressing 09/19/2023 1706 by Isiah Verle SQUIBB, RN Outcome: Progressing Goal: Knowledge of the prescribed therapeutic regimen will improve 09/19/2023 1830 by Isiah Verle SQUIBB, RN Outcome: Progressing 09/19/2023 1706 by Isiah Verle SQUIBB, RN Outcome: Progressing Goal: Understanding of discharge needs will improve 09/19/2023 1830 by Isiah Verle SQUIBB, RN Outcome: Progressing 09/19/2023 1706 by Isiah Verle SQUIBB, RN Outcome: Progressing   Problem: Activity: Goal: Ability to avoid complications of mobility impairment will improve 09/19/2023  1830 by Isiah Verle SQUIBB, RN Outcome: Progressing 09/19/2023 1706 by Isiah Verle SQUIBB, RN Outcome: Progressing Goal: Ability to tolerate increased activity will improve 09/19/2023 1830 by Isiah Verle SQUIBB, RN Outcome: Progressing 09/19/2023 1706 by Isiah Verle SQUIBB, RN Outcome: Progressing Goal: Will remain free from falls 09/19/2023 1830 by Isiah Verle SQUIBB, RN Outcome: Progressing 09/19/2023 1706 by  Kang Ishida, Verle SQUIBB, RN Outcome: Progressing   Problem: Bowel/Gastric: Goal: Gastrointestinal status for postoperative course will improve 09/19/2023 1830 by Isiah Verle SQUIBB, RN Outcome: Progressing 09/19/2023 1706 by Isiah Verle SQUIBB, RN Outcome: Progressing   Problem: Clinical Measurements: Goal: Ability to maintain clinical measurements within normal limits will improve 09/19/2023 1830 by Isiah Verle SQUIBB, RN Outcome: Progressing 09/19/2023 1706 by Isiah Verle SQUIBB, RN Outcome: Progressing Goal: Postoperative complications will be avoided or minimized 09/19/2023 1830 by Isiah Verle SQUIBB, RN Outcome: Progressing 09/19/2023 1706 by Isiah Verle SQUIBB, RN Outcome: Progressing Goal: Diagnostic test results will improve 09/19/2023 1830 by Isiah Verle SQUIBB, RN Outcome: Progressing 09/19/2023 1706 by Isiah Verle SQUIBB, RN Outcome: Progressing   Problem: Pain Management: Goal: Pain level will decrease 09/19/2023 1830 by Isiah Verle SQUIBB, RN Outcome: Progressing 09/19/2023 1706 by Isiah Verle SQUIBB, RN Outcome: Progressing   Problem: Skin Integrity: Goal: Will show signs of wound healing 09/19/2023 1830 by Isiah Verle SQUIBB, RN Outcome: Progressing 09/19/2023 1706 by Isiah Verle SQUIBB, RN Outcome: Progressing   Problem: Health Behavior/Discharge Planning: Goal: Identification of resources available to assist in meeting health care needs will improve 09/19/2023 1830 by Isiah Verle SQUIBB, RN Outcome: Progressing 09/19/2023 1706 by Isiah Verle SQUIBB, RN Outcome: Progressing   Problem: Bladder/Genitourinary: Goal: Urinary functional status for postoperative course will improve 09/19/2023 1830 by Isiah Verle SQUIBB, RN Outcome: Progressing 09/19/2023 1706 by Isiah Verle SQUIBB, RN Outcome: Progressing

## 2023-09-19 NOTE — Progress Notes (Signed)
Pt refused CPAP

## 2023-09-19 NOTE — Progress Notes (Signed)
   Neurosurgery Progress Note  History: Robert Green is s/p L2-3 discectomy, L3-5 decompression  POD3: Continues to improve but with some difficulty with mobility POD2: doing better this morning. Reports improved numbness in his foot. POD1: Pt doing well this morning depsite back pain and worsening numbness in the heel of his right foot. Denies any severe radicular pain or headache   Physical Exam: Vitals:   09/19/23 0433 09/19/23 0736  BP: (!) 148/92 119/67  Pulse: 95 81  Resp: 20 17  Temp: 98.7 F (37.1 C) 97.8 F (36.6 C)  SpO2: 100% 97%    AA Ox3 CNI  Strength:5/5 throughout BLE except 4+ in left HF and KE  Data:  Other tests/results: NA  Assessment/Plan:  Robert Green is a 63 y.o presenting with lumbar stenosis s/p L2-3 discectomy, L3-5 decompression  - mobilize - pain control - DVT prophylaxis - dressing changed - continue PTOT  Reeves Daisy Department of Neurosurgery

## 2023-09-19 NOTE — Progress Notes (Signed)
 Physical Therapy Treatment Patient Details Name: Robert Green MRN: [id number] DOB: 28-Oct-1960 Today's Date: 09/19/2023   History of Present Illness Pt is a 63 y.o. male s/p L2/3 discectomy and L3-5 decompression 09/16/23 d/t worsening back and leg pain.  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    PT Comments  Pt received up in chair this pm, had just returned from ambulating in hall with wife and support of RW. Appears to be doing well functionally this date and states may feel comfortable with potential d/c home tomorrow. Pt seen with wife for single step negotiation with RW without difficulty. Good safety awareness without physical assist required. Handouts also given for various techniques to safely negotiate 2 steps at home if needed. Pt appears functionally ready for d/c if still mobilizing well tomorrow. Initial recs for HHPT remain appropriate, pt has a bariatric RW at home.    If plan is discharge home, recommend the following: A little help with walking and/or transfers;A little help with bathing/dressing/bathroom;Assistance with cooking/housework;Assist for transportation;Help with stairs or ramp for entrance   Can travel by private vehicle        Equipment Recommendations  Other (comment) (Pt has Bariatric RW at home)    Recommendations for Other Services       Precautions / Restrictions Precautions Precautions: Fall;Back Precaution Booklet Issued: Yes (comment) Recall of Precautions/Restrictions: Intact Precaution/Restrictions Comments: no brace needed Restrictions Weight Bearing Restrictions Per Provider Order: No     Mobility  Bed Mobility Overal bed mobility: Needs Assistance Bed Mobility: Rolling, Sit to Sidelying Rolling: Supervision, Used rails Sidelying to sit: Supervision, Used rails       General bed mobility comments: Heavy  reliance on side rail to transition from sidelying to sitting EOB    Transfers Overall transfer level: Needs assistance Equipment used: Rolling walker (2 wheels) Transfers: Sit to/from Stand Sit to Stand: Supervision, Contact guard assist           General transfer comment:  (Able to rise from low recliner without physical assist)    Ambulation/Gait Ambulation/Gait assistance: Supervision Gait Distance (Feet):  (20) Assistive device: Rolling walker (2 wheels) Gait Pattern/deviations: Step-through pattern Gait velocity: decreased     General Gait Details: No dizziness, ambulating in room with RW and ModI   Stairs Stairs: Yes Stairs assistance: Contact guard assist Stair Management: No rails, Step to pattern, Backwards, With walker Number of Stairs: 1 General stair comments: Continued education and practice to safely negotiate 2 steps at home without railing. good demonstration of safe technique with RW forward and back   Wheelchair Mobility     Tilt Bed    Modified Rankin (Stroke Patients Only)       Balance Overall balance assessment: Needs assistance Sitting-balance support: No upper extremity supported, Feet supported Sitting balance-Leahy Scale: Good     Standing balance support: Bilateral upper extremity supported, During functional activity, Reliant on assistive device for balance Standing balance-Leahy Scale: Fair Standing balance comment: No LOB with distance gait training, heavy reliance on RW to offset LBP                            Communication Communication Communication: No apparent difficulties  Cognition Arousal: Alert Behavior During Therapy: WFL for tasks assessed/performed   PT - Cognitive impairments: No apparent impairments  PT - Cognition Comments: Pt is A and O x 4. very pleasnat but pain limited. Supportive spouse present Following commands: Intact      Cueing Cueing Techniques:  Verbal cues  Exercises Other Exercises Other Exercises: Pt and wife educated and given handouts for multiple techniques for stair negotiation with good understanding. Other Exercises: Educated on safe mobility, options to negotiate steps at home, benefits of sitting EOB unsupported for meals, educated on car transfers Other Exercises: Discussed purchasing large gel ice pack for home use    General Comments General comments (skin integrity, edema, etc.):  (Back remains red and iritate from known allergic reaction, Back dressing intact without noted drainage.)      Pertinent Vitals/Pain Pain Assessment Pain Assessment: 0-10 Pain Score: 3  Pain Location: lower back Pain Descriptors / Indicators: Discomfort, Aching Pain Intervention(s): Premedicated before session    Home Living                          Prior Function            PT Goals (current goals can now be found in the care plan section) Acute Rehab PT Goals Patient Stated Goal: to improve strength, pain, and walking Progress towards PT goals: Progressing toward goals    Frequency    BID      PT Plan      Co-evaluation              AM-PAC PT 6 Clicks Mobility   Outcome Measure  Help needed turning from your back to your side while in a flat bed without using bedrails?: A Little Help needed moving from lying on your back to sitting on the side of a flat bed without using bedrails?: A Little Help needed moving to and from a bed to a chair (including a wheelchair)?: A Little Help needed standing up from a chair using your arms (e.g., wheelchair or bedside chair)?: A Little Help needed to walk in hospital room?: A Little Help needed climbing 3-5 steps with a railing? : A Little 6 Click Score: 18    End of Session Equipment Utilized During Treatment: Gait belt Activity Tolerance: Patient tolerated treatment well Patient left: Other (comment) (In bathroom with assist from wife) Nurse  Communication: Mobility status PT Visit Diagnosis: Other abnormalities of gait and mobility (R26.89);Muscle weakness (generalized) (M62.81);Pain Pain - part of body:  (lower back)     Time: 8391-8375 PT Time Calculation (min) (ACUTE ONLY): 16 min  Charges:    $Gait Training: 8-22 mins $Therapeutic Exercise: 8-22 mins PT General Charges $$ ACUTE PT VISIT: 1 Visit                    Darice Bohr, PTA  Darice JAYSON Bohr 09/19/2023, 4:50 PM

## 2023-09-19 NOTE — Progress Notes (Signed)
 Physical Therapy Treatment Patient Details Name: Robert Green MRN: [id number] DOB: 01/19/1961 Today's Date: 09/19/2023   History of Present Illness Pt is a 63 y.o. male s/p L2/3 discectomy and L3-5 decompression 09/16/23 d/t worsening back and leg pain.  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    PT Comments  Improved tolerance for mobility this date, no c/o dizziness or HA, VSS. Wife present for session and education on safe mobility with RW, negotiation of stairs in prep for d/c home, and back precautions. Pt with 6/10 lower back pain post gait training and initial stair training. Pt given ice, nursing in with Robaxin , will continue PT in pm once pre-medicated again.    If plan is discharge home, recommend the following: A little help with walking and/or transfers;A little help with bathing/dressing/bathroom;Assistance with cooking/housework;Assist for transportation;Help with stairs or ramp for entrance   Can travel by private vehicle        Equipment Recommendations  Other (comment) (Pt has a Bariatric RW at home)    Recommendations for Other Services       Precautions / Restrictions Precautions Precautions: Fall;Back Precaution Booklet Issued: Yes (comment) Recall of Precautions/Restrictions: Intact Precaution/Restrictions Comments: no brace needed Restrictions Weight Bearing Restrictions Per Provider Order: No     Mobility  Bed Mobility Overal bed mobility: Needs Assistance Bed Mobility: Rolling, Sit to Sidelying Rolling: Supervision, Used rails Sidelying to sit: Supervision, Used rails       General bed mobility comments: Heavy reliance on side rail to transition from sidelying to sitting EOB    Transfers Overall transfer level: Needs assistance Equipment used: Rolling walker (2 wheels) Transfers: Sit to/from Stand Sit to Stand:  Supervision, Contact guard assist           General transfer comment: CGA for safety. no physical lifting assistnace to achieve standing    Ambulation/Gait Ambulation/Gait assistance: Supervision Gait Distance (Feet): 165 Feet Assistive device: Rolling walker (2 wheels) (Bariatric) Gait Pattern/deviations: Step-to pattern Gait velocity: decreased     General Gait Details:  (No dizziness during gait training. RW adjusted to facilitate upright posture)   Stairs Stairs: Yes Stairs assistance: Contact guard assist Stair Management: Two rails, Step to pattern, Forwards Number of Stairs: 4 General stair comments: Pt has 2 steps to enter without rail, will continue to progress, wife present for initial trial leading with R LE   Wheelchair Mobility     Tilt Bed    Modified Rankin (Stroke Patients Only)       Balance Overall balance assessment: Needs assistance Sitting-balance support: No upper extremity supported, Feet supported Sitting balance-Leahy Scale: Good     Standing balance support: Bilateral upper extremity supported, During functional activity, Reliant on assistive device for balance Standing balance-Leahy Scale: Fair Standing balance comment: No LOB with distance gait training, heavy reliance on RW to offset LBP                            Communication Communication Communication: No apparent difficulties  Cognition Arousal: Alert Behavior During Therapy: WFL for tasks assessed/performed   PT - Cognitive impairments: No apparent impairments                       PT - Cognition Comments: Pt is A and O x 4. very pleasnat but pain limited. Supportive spouse  present Following commands: Intact      Cueing Cueing Techniques: Verbal cues  Exercises Other Exercises Other Exercises: Pt and wife educated on AP, LAQ, sit<>stand reps, and supported marching in place. Other Exercises: Educated on safe mobility, options to negotiate steps at  home, benefits of sitting EOB unsupported for meals Other Exercises: Discussed purchasing large gel ice pack for home use    General Comments General comments (skin integrity, edema, etc.):  (Back remains red and iritate from known allergic reaction, Back dressing intact without noted drainage.)      Pertinent Vitals/Pain Pain Assessment Pain Assessment: 0-10 Pain Score: 6  Pain Location: lower back Pain Descriptors / Indicators: Discomfort, Aching Pain Intervention(s): Patient requesting pain meds-RN notified, Ice applied    Home Living                          Prior Function            PT Goals (current goals can now be found in the care plan section) Acute Rehab PT Goals Patient Stated Goal: to improve strength, pain, and walking Progress towards PT goals: Progressing toward goals    Frequency    BID      PT Plan      Co-evaluation              AM-PAC PT 6 Clicks Mobility   Outcome Measure  Help needed turning from your back to your side while in a flat bed without using bedrails?: A Little Help needed moving from lying on your back to sitting on the side of a flat bed without using bedrails?: A Little Help needed moving to and from a bed to a chair (including a wheelchair)?: A Little Help needed standing up from a chair using your arms (e.g., wheelchair or bedside chair)?: A Little Help needed to walk in hospital room?: A Little Help needed climbing 3-5 steps with a railing? : A Lot 6 Click Score: 17    End of Session Equipment Utilized During Treatment: Gait belt Activity Tolerance: Patient tolerated treatment well Patient left: in bed;with call bell/phone within reach;with bed alarm set;with family/visitor present Nurse Communication: Mobility status;Patient requests pain meds PT Visit Diagnosis: Other abnormalities of gait and mobility (R26.89);Muscle weakness (generalized) (M62.81);Pain Pain - part of body:  (back)     Time:  8777-8753 PT Time Calculation (min) (ACUTE ONLY): 24 min  Charges:    $Gait Training: 8-22 mins $Therapeutic Exercise: 8-22 mins PT General Charges $$ ACUTE PT VISIT: 1 Visit                    Darice Bohr, PTA  Darice JAYSON Bohr 09/19/2023, 1:35 PM

## 2023-09-19 NOTE — Progress Notes (Signed)
 Occupational Therapy Treatment Patient Details Name: LAYLA GRAMM MRN: [id number] DOB: 06/07/1960 Today's Date: 09/19/2023   History of present illness Pt is a 63 y.o. male s/p L2/3 discectomy and L3-5 decompression 09/16/23 d/t worsening back and leg pain.  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   OT comments  Chart reviewed to date, pt greeted sitting on edge of bed with wife present, reports he continue to feel much better, no dizziness/HA at this time. Pt is agreeable to OT tx session targeting improving functional activity tolerance in prep for ADL tasks. Provided pt with bariatric bsc for ease of use during toileting with pt performing toilet transfer with supervision-CGA with RW. Pt amb approx 200' with RW with supervision-CGA. Pt is making good progress towards goals, discharge recommendation remains appropriate.       If plan is discharge home, recommend the following:  A little help with bathing/dressing/bathroom;A little help with walking and/or transfers;Help with stairs or ramp for entrance;Assistance with cooking/housework;Assist for transportation   Equipment Recommendations  Other (comment) (bariatric bsc)    Recommendations for Other Services      Precautions / Restrictions Precautions Precautions: Fall;Back Recall of Precautions/Restrictions: Intact Precaution/Restrictions Comments: no brace needed Restrictions Weight Bearing Restrictions Per Provider Order: No       Mobility Bed Mobility Overal bed mobility: Needs Assistance Bed Mobility: Rolling, Sit to Sidelying Rolling: Supervision       Sit to sidelying: Contact guard assist, HOB elevated, Used rails      Transfers Overall transfer level: Needs assistance Equipment used: Rolling walker (2 wheels) Transfers: Sit to/from Stand Sit to Stand: Supervision, Contact guard  assist                 Balance Overall balance assessment: Needs assistance Sitting-balance support: No upper extremity supported, Feet supported Sitting balance-Leahy Scale: Good     Standing balance support: Bilateral upper extremity supported, During functional activity, Reliant on assistive device for balance Standing balance-Leahy Scale: Fair Standing balance comment: one instance of posterior lean/sway with Dr. Katrina in the room, pt appears to self correct                           ADL either performed or assessed with clinical judgement   ADL Overall ADL's : Needs assistance/impaired     Grooming: Supervision/safety;Standing;Wash/dry hands                   Toilet Transfer: Supervision/safety;Contact guard assist;Rolling walker (2 wheels) (bari bsc)   Toileting- Clothing Manipulation and Hygiene: Supervision/safety;Sitting/lateral lean       Functional mobility during ADLs: Supervision/safety;Contact guard assist;Rolling walker (2 wheels) (approx 200')      Extremity/Trunk Assessment              Vision       Perception     Praxis     Communication Communication Communication: No apparent difficulties   Cognition Arousal: Alert Behavior During Therapy: WFL for tasks assessed/performed Cognition: No apparent impairments                               Following commands: Intact        Cueing   Cueing Techniques: Verbal cues  Exercises Other Exercises Other Exercises: edu pt wife re: safe ADL completion  with DME including bari bsc    Shoulder Instructions       General Comments MD in room to replace dressing pre session- appears the same post session    Pertinent Vitals/ Pain       Pain Assessment Pain Assessment: 0-10 Pain Score: 5  Pain Location: lower back Pain Descriptors / Indicators: Discomfort, Aching Pain Intervention(s): Monitored during session, Repositioned  Home Living                                           Prior Functioning/Environment              Frequency  Min 3X/week        Progress Toward Goals  OT Goals(current goals can now be found in the care plan section)  Progress towards OT goals: Progressing toward goals  Acute Rehab OT Goals Time For Goal Achievement: 10/02/23  Plan      Co-evaluation                 AM-PAC OT 6 Clicks Daily Activity     Outcome Measure   Help from another person eating meals?: None Help from another person taking care of personal grooming?: None Help from another person toileting, which includes using toliet, bedpan, or urinal?: A Little Help from another person bathing (including washing, rinsing, drying)?: A Little Help from another person to put on and taking off regular upper body clothing?: None Help from another person to put on and taking off regular lower body clothing?: A Lot 6 Click Score: 20    End of Session Equipment Utilized During Treatment: Rolling walker (2 wheels)  OT Visit Diagnosis: Other abnormalities of gait and mobility (R26.89)   Activity Tolerance Patient tolerated treatment well   Patient Left in bed;with call bell/phone within reach;with bed alarm set   Nurse Communication          Time: 9054-8985 OT Time Calculation (min): 29 min  Charges: OT General Charges $OT Visit: 1 Visit OT Treatments $Self Care/Home Management : 8-22 mins $Therapeutic Activity: 8-22 mins  Therisa Sheffield, OTD OTR/L  09/19/23, 12:58 PM

## 2023-09-19 NOTE — Progress Notes (Addendum)
 At around 0227 pt tried to get out of bed to the bed side commode. Her legs gave out and she fell into her recliner next to the bed. Noise was here and staff rushed to the room to find the pt on her knees leaning against the recliner. Pt denied injuries. MD was notified and responded by checking on the pt. VS were performed and within normal limits. Safety zone was done. Attempts to reach pt's contacts were unsuccessful.

## 2023-09-19 NOTE — Progress Notes (Signed)
 Patient is not able to walk the distance required to go the bathroom, or he/she is unable to safely negotiate stairs required to access the bathroom.  A 3in1 Bariatric BSC will alleviate this problem.

## 2023-09-19 NOTE — TOC Progression Note (Deleted)
 Transition of Care [facility]) - Progression Note    Patient Details  Name: Robert Green MRN: [id number] Date of Birth: 1960/09/29  [facility] of [facility] CM/SW Contact  Lorraine LILLETTE Fenton, LCSW Phone Number: 09/19/2023, 10:34 AM  Clinical Narrative:    Patient is not able to walk the distance required to go the bathroom, or he/she is unable to safely negotiate stairs required to access the bathroom.  A 3in1 Bariatric BSC will alleviate this problem.                      Expected Discharge Plan and Services                                               Social Drivers of Health (SDOH) Interventions SDOH Screenings   Food Insecurity: No Food Insecurity (09/16/2023)  Housing: Low Risk  (09/16/2023)  Transportation Needs: No Transportation Needs (09/16/2023)  Utilities: Not At Risk (09/16/2023)  Financial Resource Strain: Low Risk  (03/03/2022)   Received from [facility]  Social Connections: Moderately Isolated (09/16/2023)  Tobacco Use: Medium Risk (09/16/2023)    Readmission Risk Interventions     No data to display

## 2023-09-19 NOTE — TOC Progression Note (Signed)
 Transition of Care [facility]) - Progression Note    Patient Details  Name: KOWEN KLUTH MRN: [id number] Date of Birth: [birth year]  Transition of Care [facility]) CM/SW Contact  Lorraine LILLETTE Fenton, LCSW Phone Number: 09/19/2023, 10:48 AM  Clinical Narrative:     CSW contacted by [facility] Bariatric size 3in1 recommended for pt to use at home.  CSW made call to spouse who confirmed that pt needs one at home and prefer delivery to hospital as they live in [place], spouse had no preference for DME provider. CSW prepared note for MD to co-sign, then called [employer] to request the note.  IPCM following.                     Expected Discharge Plan and Services                                               Social Drivers of Health (SDOH) Interventions SDOH Screenings   Food Insecurity: No Food Insecurity (09/16/2023)  Housing: Low Risk  (09/16/2023)  Transportation Needs: No Transportation Needs (09/16/2023)  Utilities: Not At Risk (09/16/2023)  Financial Resource Strain: Low Risk  (03/03/2022)   Received from [facility]  Social Connections: Moderately Isolated (09/16/2023)  Tobacco Use: Medium Risk (09/16/2023)    Readmission Risk Interventions     No data to display

## 2023-09-20 MED ORDER — OXYCODONE-ACETAMINOPHEN 5-325 MG PO TABS: 1.0000 | ORAL_TABLET | ORAL | 0 refills | Status: DC | PRN

## 2023-09-20 MED ORDER — SENNA 8.6 MG PO TABS: 1.0000 | ORAL_TABLET | Freq: Two times a day (BID) | ORAL | 0 refills | Status: DC

## 2023-09-20 MED ORDER — METHOCARBAMOL 500 MG PO TABS: 500.0000 mg | ORAL_TABLET | Freq: Four times a day (QID) | ORAL | 0 refills | Status: DC | PRN

## 2023-09-20 NOTE — Progress Notes (Addendum)
   REFERRING PHYSICIAN:  No referring provider defined for this encounter.  DOS: 09/16/23  Open left L2-L3 microdiscectomy and open L3-L5 laminectomy  HISTORY OF PRESENT ILLNESS: Robert Green is approximately 2 weeks status post above surgery. Was given robaxin  and oxycodone  on discharge from the hospital.   He had extensive epidural scarring and had a small durotomy directly repaired during surgery. He was on bedrest for 36 hours and the mobilized.    He is doing well. His preop right foot numbness is gone. Tingling in both feet is improved. Preop lateral hip and buttock pain is better. He has expected LBP.   He is taking tylenol , robaxin , and ultram . He gets ultram  from pain clinic.   He has restarted his ELIQUIS today.    PHYSICAL EXAMINATION:  General: Patient is well developed, well nourished, calm, collected, and in no apparent distress.   NEUROLOGICAL:  General: In no acute distress.   Awake, alert, oriented to person, place, and time.  Pupils equal round and reactive to light.  Facial tone is symmetric.     Strength:           Side Iliopsoas Quads Hamstring PF DF EHL  R 5 5 5 5 5 5   L 5 4+ 5 5 5 5    Incision c/d/I, sutures were removed, he has some redness at incision that looks like irritation from sutures. No signs of infection.    ROS (Neurologic):  Negative except as noted above  IMAGING: Nothing new to review.   ASSESSMENT/PLAN:  Robert Green is doing well s/p above surgery. Treatment options reviewed with patient and following plan made:   - I have advised the patient to lift up to 10 pounds until 6 weeks after surgery (follow up with Dr. Clois).  - Reviewed wound care.  - No bending, twisting, or lifting.  - Continue on current medications including robaxin , prn ultram , and prn tylenol .   - Restart ELIQUIS today (POD#14).  - Will check with Dr. Clois to see when he can start cellcept and message him- he can start in 3 weeks if incision looks  okay. Message sent to patient.  - Follow up as scheduled in 4 weeks and prn.   Of note, his skin was cleaned with chloraprep prior to removing his sutures. He has an allergy to chloraprep and once this was realized, his skin was cleaned with alcohol. Chloraprep was on his skin for less than 10 seconds. He will monitor his incision and let us  know if any issues. Can call in medrol  dose pack if needed.   Advised to contact the office if any questions or concerns arise.  Glade Boys PA-C Department of neurosurgery

## 2023-09-20 NOTE — Progress Notes (Signed)
   Neurosurgery Progress Note  History: Robert Green is s/p L2-3 discectomy, L3-5 decompression  POD4: Doing better. POD3: Continues to improve but with some difficulty with mobility POD2: doing better this morning. Reports improved numbness in his foot. POD1: Pt doing well this morning depsite back pain and worsening numbness in the heel of his right foot. Denies any severe radicular pain or headache   Physical Exam: Vitals:   09/19/23 2022 09/20/23 0149  BP: (!) 102/46 (P) 110/68  Pulse: 89 (P) 92  Resp: 19 (P) 19  Temp: 98 F (36.7 C) (P) 99.2 F (37.3 C)  SpO2: 100% (P) 100%    AA Ox3 CNI  Strength:5/5 throughout BLE except 4+ in left HF and KE  Data:  Other tests/results: NA  Assessment/Plan:  Robert Green is a 63 y.o presenting with lumbar stenosis s/p L2-3 discectomy, L3-5 decompression  - mobilize - pain control - DVT prophylaxis - continue PTOT  Reeves Daisy Department of Neurosurgery

## 2023-09-20 NOTE — Discharge Summary (Signed)
 Physician Discharge Summary  Patient ID: Robert Green MRN: [id number] DOB/AGE: Apr 22, 1960 63 y.o.  Admit date: 09/16/2023 Discharge date: 09/20/2023  Admission Diagnoses: Principal Problem:   S/P lumbar discectomy Active Problems:   Lumbar stenosis with neurogenic claudication   Lumbar radiculopathy  Discharge Diagnoses:  Principal Problem:   S/P lumbar discectomy Active Problems:   Lumbar stenosis with neurogenic claudication   Lumbar radiculopathy   Discharged Condition: good  Hospital Course: Robert Green presented with lumbar stenosis.  He had extensive epidural scarring and had a small durotomy directly repaired during surgery.  He was on bedrest for 36 hours, then mobilized progressively.    He was felt to be stable for discharge on POD4.  Consults: None  Significant Diagnostic Studies: radiology: X-Ray: L2-5 localization  Treatments: surgery: L2-5 decompression with L L2/3 discectomy  Discharge Exam: Blood pressure 116/88, pulse 92, temperature 97.9 F (36.6 C), temperature source Oral, resp. rate (P) 19, height 6' 4 (1.93 m), weight (!) 152 kg, SpO2 98%. General appearance: alert and cooperative MAEW - walking with walker Incision c/d/I  Disposition: Discharge disposition: 01-Home or Self Care       Discharge Instructions     Discharge patient   Complete by: As directed    Discharge disposition: 01-Home or Self Care   Discharge patient date: 09/20/2023   Face-to-face encounter (required for Medicare/Medicaid patients)   Complete by: As directed    I REEVES DAISY certify that this patient is under my care and that I, or a nurse practitioner or physician's assistant working with me, had a face-to-face encounter that meets the physician face-to-face encounter requirements with this patient on 09/20/2023. The encounter with the patient was in whole, or in part for the following medical condition(s) which is the primary reason for home health care (List  medical condition): lumbar stenosis   The encounter with the patient was in whole, or in part, for the following medical condition, which is the primary reason for home health care: lumbar stenosis   I certify that, based on my findings, the following services are medically necessary home health services:  Physical therapy Nursing     Reason for Medically Necessary Home Health Services: Therapy- Therapeutic Exercises to Increase Strength and Endurance   My clinical findings support the need for the above services: Pain interferes with ambulation/mobility   Further, I certify that my clinical findings support that this patient is homebound due to: Pain interferes with ambulation/mobility   Home Health   Complete by: As directed    To provide the following care/treatments:  PT OT     Incentive spirometry RT   Complete by: As directed       Allergies as of 09/20/2023       Reactions   Chlorhexidine  Dermatitis, Itching, Rash, Swelling   Pt develops skin rash   Adhesive [tape] Dermatitis   Tetracyclines & Related    Citalopram Palpitations        Medication List     STOP taking these medications    traMADol  50 MG tablet Commonly known as: Ultram        TAKE these medications    albuterol  108 (90 Base) MCG/ACT inhaler Commonly known as: VENTOLIN  HFA Inhale 2 puffs into the lungs every 6 (six) hours as needed for wheezing or shortness of breath.   cetirizine 10 MG tablet Commonly known as: ZYRTEC Take 10 mg by mouth at bedtime.   clobetasol  ointment 0.05 % Commonly known as: TEMOVATE  Apply 1  Application topically daily as needed (irritation).   D 1000 25 MCG (1000 UT) capsule Generic drug: Cholecalciferol Take 1,000 Units by mouth daily.   diltiazem  30 MG tablet Commonly known as: CARDIZEM  Take 30 mg by mouth every 6 (six) hours as needed (A-fib).   Eliquis 5 MG Tabs tablet Generic drug: apixaban Take 5 mg by mouth 2 (two) times daily.   furosemide  20 MG  tablet Commonly known as: LASIX  Take 20 mg by mouth daily as needed for fluid or edema.   hydrOXYzine  25 MG tablet Commonly known as: ATARAX  Take 25 mg by mouth every 8 (eight) hours as needed for anxiety.   LORazepam 2 MG tablet Commonly known as: ATIVAN Take 2 mg by mouth every 6 (six) hours as needed for sleep.   magnesium  oxide 400 MG tablet Commonly known as: MAG-OX Take 400 mg by mouth daily.   methocarbamol  500 MG tablet Commonly known as: ROBAXIN  Take 1-2 tablets (500-1,000 mg total) by mouth every 6 (six) hours as needed for muscle spasms. What changed:  how much to take when to take this reasons to take this   mometasone 50 MCG/ACT nasal spray Commonly known as: NASONEX Place 2 sprays into the nose daily as needed (allergies).   montelukast  10 MG tablet Commonly known as: SINGULAIR  Take 10 mg by mouth at bedtime.   mupirocin ointment 2 % Commonly known as: BACTROBAN Apply 1 Application topically 2 (two) times daily as needed (irritation).   NON FORMULARY Pt uses a c-pap nightly   oxyCODONE -acetaminophen  5-325 MG tablet Commonly known as: PERCOCET/ROXICET Take 1-2 tablets by mouth every 4 (four) hours as needed for up to 5 days for moderate pain (pain score 4-6).   pantoprazole  40 MG tablet Commonly known as: PROTONIX  Take 40 mg by mouth daily as needed (acid reflux).   senna 8.6 MG Tabs tablet Commonly known as: SENOKOT Take 1 tablet (8.6 mg total) by mouth 2 (two) times daily.   Synthroid  137 MCG tablet Generic drug: levothyroxine  Take 137 mcg by mouth at bedtime.        Follow-up Information     Hilma Hastings, PA-C Follow up on 09/30/2023.   Specialty: Neurosurgery Why: 1030 am Contact information: [address] [place] [address] [phone]                 Signed: REEVES DAISY 09/20/2023, 8:13 AM

## 2023-09-20 NOTE — TOC Progression Note (Signed)
 Transition of Care [facility]) - Progression Note    Patient Details  Name: Robert Green MRN: [id number] Date of Birth: 1960-09-20  [facility] CM/SW Contact  Lorraine LILLETTE Fenton, LCSW Phone Number: 09/20/2023, 9:34 AM  Clinical Narrative:    CSW reviewed a VM from spouse left on 7/26- she no longer feels 3 In 1 needed and wants to cancel.  CSW reached out to spouse who is at hospital, equipment has not arrived. CSW agreed to attempt to intercept-explained it may have been billed and be out for delivery so I may not be successful. CSW was only able to leave a message- for [employer].  9:35 MD entered [place] that he can order HHPTOT but acknowledged family not local residents.  CSW responded that we likely will not have providers servicing [place] - but I reached out to verify, then stated they may need to follow up with PCP. MD acknowledged- patient up for DC. RN added to [place] that pt wants out patient PTOT.  No furthe IPCM needs.                      Expected Discharge Plan and Services         Expected Discharge Date: 09/20/23                                     Social Drivers of Health (SDOH) Interventions SDOH Screenings   Food Insecurity: No Food Insecurity (09/16/2023)  Housing: Low Risk  (09/16/2023)  Transportation Needs: No Transportation Needs (09/16/2023)  Utilities: Not At Risk (09/16/2023)  Financial Resource Strain: Low Risk  (03/03/2022)   Received from [facility]  Social Connections: Moderately Isolated (09/16/2023)  Tobacco Use: Medium Risk (09/16/2023)    Readmission Risk Interventions     No data to display

## 2023-09-20 NOTE — Progress Notes (Signed)
 PT Cancellation Note  Patient Details Name: SALAH NAKAMURA MRN: [id number] DOB: 24-Nov-1960   Cancelled Treatment:    Reason Eval/Treat Not Completed: Other (comment)  Offered session.  Pt stated he has been up this AM with no issues.  No further questions or concerns.  Declined further therapy interventions at this time.  Awaiting DC.   Lauraine Gills 09/20/2023, 9:27 AM

## 2023-09-21 ENCOUNTER — Other Ambulatory Visit: Payer: Self-pay | Admitting: Neurosurgery

## 2023-09-21 DIAGNOSIS — M48062 Spinal stenosis, lumbar region with neurogenic claudication: Secondary | ICD-10-CM

## 2023-09-30 ENCOUNTER — Encounter: Payer: Self-pay | Admitting: Orthopedic Surgery

## 2023-09-30 ENCOUNTER — Ambulatory Visit (INDEPENDENT_AMBULATORY_CARE_PROVIDER_SITE_OTHER): Admitting: Orthopedic Surgery

## 2023-09-30 VITALS — BP 118/70 | Temp 97.6°F | Ht 76.0 in | Wt 335.0 lb

## 2023-09-30 DIAGNOSIS — M48062 Spinal stenosis, lumbar region with neurogenic claudication: Secondary | ICD-10-CM

## 2023-09-30 DIAGNOSIS — Z09 Encounter for follow-up examination after completed treatment for conditions other than malignant neoplasm: Secondary | ICD-10-CM

## 2023-09-30 DIAGNOSIS — Z9889 Other specified postprocedural states: Secondary | ICD-10-CM

## 2023-10-27 ENCOUNTER — Encounter: Payer: Self-pay | Admitting: Neurosurgery

## 2023-10-27 ENCOUNTER — Ambulatory Visit (INDEPENDENT_AMBULATORY_CARE_PROVIDER_SITE_OTHER): Admitting: Neurosurgery

## 2023-10-27 VITALS — BP 122/70 | Temp 98.2°F | Ht 76.0 in | Wt 335.0 lb

## 2023-10-27 DIAGNOSIS — M5416 Radiculopathy, lumbar region: Secondary | ICD-10-CM

## 2023-10-27 DIAGNOSIS — Z9889 Other specified postprocedural states: Secondary | ICD-10-CM

## 2023-10-27 DIAGNOSIS — M48062 Spinal stenosis, lumbar region with neurogenic claudication: Secondary | ICD-10-CM

## 2023-10-27 NOTE — Progress Notes (Signed)
   REFERRING PHYSICIAN:  No referring provider defined for this encounter.  DOS: 09/16/23  Open left L2-L3 microdiscectomy and open L3-L5 laminectomy  HISTORY OF PRESENT ILLNESS: Robert Green is status post above surgery. \  Unfortunate, he suffered a urinary tract infection with Klebsiella.  He was treated with antibiotics.  His follow-up culture has been negative.    PHYSICAL EXAMINATION:  General: Patient is well developed, well nourished, calm, collected, and in no apparent distress.   NEUROLOGICAL:  General: In no acute distress.   Awake, alert, oriented to person, place, and time.  Pupils equal round and reactive to light.  Facial tone is symmetric.     Strength:           Side Iliopsoas Quads Hamstring PF DF EHL  R 5 5 5 5 5 5   L 5 4+ 5 5 5 5    Incision c/d/I,    ROS (Neurologic):  Negative except as noted above  IMAGING: Nothing new to review.   ASSESSMENT/PLAN:  Robert Green is doing well s/p above surgery.   He is cleared to resume any medications he needs.  I would like to start him on physical therapy for his left leg.  We discussed activity limitations.  We will see him back in 6 weeks.    Reeves Daisy MD Department of neurosurgery

## 2023-12-04 NOTE — Progress Notes (Signed)
   REFERRING PHYSICIAN:  No referring provider defined for this encounter.  DOS: 09/16/23  Open left L2-L3 microdiscectomy and open L3-L5 laminectomy  HISTORY OF PRESENT ILLNESS:  Was doing well at last visit except he was dealing with UTI with klebsiella.   He was to start PT for his left leg. He has minimal intermittent LBP that is tolerable. His left leg pain is better. He still notes some numbness in balls of both feet- this has slowly improved since surgery.   He is seeing pain management ([facility]) and they have him on ultram . He is scheduled for possible lumbar RFA in January.   His UTI is finally cleared up- he had to have IV antibiotics at infusion center as an outpatient.     PHYSICAL EXAMINATION:  General: Patient is well developed, well nourished, calm, collected, and in no apparent distress.   NEUROLOGICAL:  General: In no acute distress.   Awake, alert, oriented to person, place, and time.  Pupils equal round and reactive to light.  Facial tone is symmetric.     Strength:          Side Iliopsoas Quads Hamstring PF DF EHL  R 5 5 5 5 5 5   L 5 5 5 5 5 5    Incision well healed   ROS (Neurologic):  Negative except as noted above  IMAGING: Nothing new to review.   ASSESSMENT/PLAN:  Robert Green is doing well s/p above surgery. Treatment options reviewed with patient and following plan made:   - He can return to activity as tolerated.  - Finish out PT for lumbar spine/left leg. He's about half way through.  - He will follow up prn.   Advised to contact the office if any questions or concerns arise.  Glade Boys PA-C Department of neurosurgery

## 2023-12-08 ENCOUNTER — Ambulatory Visit: Admitting: Orthopedic Surgery

## 2023-12-08 ENCOUNTER — Encounter: Payer: Self-pay | Admitting: Orthopedic Surgery

## 2023-12-08 VITALS — BP 110/70 | Wt 340.0 lb

## 2023-12-08 DIAGNOSIS — Z09 Encounter for follow-up examination after completed treatment for conditions other than malignant neoplasm: Secondary | ICD-10-CM

## 2023-12-08 DIAGNOSIS — Z9889 Other specified postprocedural states: Secondary | ICD-10-CM

## 2023-12-08 DIAGNOSIS — M48062 Spinal stenosis, lumbar region with neurogenic claudication: Secondary | ICD-10-CM

## 2023-12-08 DIAGNOSIS — M5416 Radiculopathy, lumbar region: Secondary | ICD-10-CM

## 2023-12-08 NOTE — Patient Instructions (Addendum)
 Patient relations department [phone].

## 2024-02-05 NOTE — Telephone Encounter (Signed)
 See MyChart message to patient.

## 2024-02-05 NOTE — Telephone Encounter (Signed)
-----   Message from [facility] sent at 02/05/2024  1:59 PM EST ----- Last OV note says finish PT but nothing about a follow up plan, does he need to be seen? ----- Message ----- From: Rulon Reche FALCON Sent: 02/05/2024   1:58 PM EST To: [facility]  Please advise if patient needs a follow up appointment.

## 2024-02-05 NOTE — Telephone Encounter (Signed)
 Last office visit: 12/10/2023 Next office visit: 04/26/2024  Medication refilled per protocol.   Gustav Lutes, RN 02/05/2024 12:44 PM

## 2024-03-12 ENCOUNTER — Other Ambulatory Visit: Payer: Self-pay | Admitting: Neurosurgery
